# Patient Record
Sex: Female | Born: 1960 | Race: White | Hispanic: No | Marital: Married | Smoking: Current every day smoker
Health system: Southern US, Community
[De-identification: ages and names within clinical notes are randomized; demographics above are authoritative.]

## PROBLEM LIST (undated history)

## (undated) DIAGNOSIS — J309 Allergic rhinitis, unspecified: Secondary | ICD-10-CM

## (undated) DIAGNOSIS — E785 Hyperlipidemia, unspecified: Secondary | ICD-10-CM

## (undated) DIAGNOSIS — R5382 Chronic fatigue, unspecified: Secondary | ICD-10-CM

## (undated) DIAGNOSIS — Q231 Congenital insufficiency of aortic valve: Secondary | ICD-10-CM

## (undated) DIAGNOSIS — M5136 Other intervertebral disc degeneration, lumbar region: Secondary | ICD-10-CM

## (undated) DIAGNOSIS — G9332 Myalgic encephalomyelitis/chronic fatigue syndrome: Secondary | ICD-10-CM

## (undated) DIAGNOSIS — Z9889 Other specified postprocedural states: Secondary | ICD-10-CM

## (undated) DIAGNOSIS — G894 Chronic pain syndrome: Secondary | ICD-10-CM

## (undated) DIAGNOSIS — K219 Gastro-esophageal reflux disease without esophagitis: Secondary | ICD-10-CM

## (undated) DIAGNOSIS — E039 Hypothyroidism, unspecified: Secondary | ICD-10-CM

## (undated) DIAGNOSIS — F32A Depression, unspecified: Secondary | ICD-10-CM

## (undated) DIAGNOSIS — Q2381 Bicuspid aortic valve: Secondary | ICD-10-CM

## (undated) DIAGNOSIS — F329 Major depressive disorder, single episode, unspecified: Secondary | ICD-10-CM

## (undated) DIAGNOSIS — G43909 Migraine, unspecified, not intractable, without status migrainosus: Secondary | ICD-10-CM

## (undated) DIAGNOSIS — I10 Essential (primary) hypertension: Secondary | ICD-10-CM

## (undated) DIAGNOSIS — M51369 Other intervertebral disc degeneration, lumbar region without mention of lumbar back pain or lower extremity pain: Secondary | ICD-10-CM

## (undated) HISTORY — DX: Hyperlipidemia, unspecified: E78.5

## (undated) HISTORY — DX: Myalgic encephalomyelitis/chronic fatigue syndrome: G93.32

## (undated) HISTORY — DX: Allergic rhinitis, unspecified: J30.9

## (undated) HISTORY — DX: Hypothyroidism, unspecified: E03.9

## (undated) HISTORY — DX: Chronic pain syndrome: G89.4

## (undated) HISTORY — DX: Chronic fatigue, unspecified: R53.82

## (undated) HISTORY — DX: Essential (primary) hypertension: I10

## (undated) HISTORY — DX: Migraine, unspecified, not intractable, without status migrainosus: G43.909

## (undated) HISTORY — DX: Gastro-esophageal reflux disease without esophagitis: K21.9

## (undated) HISTORY — DX: Other intervertebral disc degeneration, lumbar region without mention of lumbar back pain or lower extremity pain: M51.369

## (undated) HISTORY — DX: Bicuspid aortic valve: Q23.81

## (undated) HISTORY — DX: Congenital insufficiency of aortic valve: Q23.1

## (undated) HISTORY — PX: NASAL SEPTUM SURGERY: SHX37

## (undated) HISTORY — DX: Depression, unspecified: F32.A

## (undated) HISTORY — DX: Other intervertebral disc degeneration, lumbar region: M51.36

## (undated) HISTORY — DX: Major depressive disorder, single episode, unspecified: F32.9

## (undated) HISTORY — PX: NASAL FRACTURE SURGERY: SHX718

---
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
## 1988-08-23 HISTORY — PX: ABDOMINAL HYSTERECTOMY: SHX81

## 2008-08-23 HISTORY — PX: LUMBAR LAMINECTOMY: SHX95

## 2009-06-02 ENCOUNTER — Ambulatory Visit: Payer: Self-pay | Admitting: Cardiology

## 2009-07-25 ENCOUNTER — Ambulatory Visit (HOSPITAL_COMMUNITY): Admission: RE | Admit: 2009-07-25 | Discharge: 2009-07-26 | Payer: Self-pay | Admitting: Neurosurgery

## 2009-08-23 HISTORY — PX: OTHER SURGICAL HISTORY: SHX169

## 2009-12-23 ENCOUNTER — Ambulatory Visit: Payer: Self-pay | Admitting: Orthopedic Surgery

## 2009-12-23 DIAGNOSIS — S92309A Fracture of unspecified metatarsal bone(s), unspecified foot, initial encounter for closed fracture: Secondary | ICD-10-CM | POA: Insufficient documentation

## 2009-12-30 ENCOUNTER — Ambulatory Visit: Payer: Self-pay | Admitting: Orthopedic Surgery

## 2009-12-30 ENCOUNTER — Encounter (INDEPENDENT_AMBULATORY_CARE_PROVIDER_SITE_OTHER): Payer: Self-pay | Admitting: *Deleted

## 2010-01-01 ENCOUNTER — Encounter: Payer: Self-pay | Admitting: Orthopedic Surgery

## 2010-01-02 ENCOUNTER — Encounter: Payer: Self-pay | Admitting: Orthopedic Surgery

## 2010-01-27 ENCOUNTER — Encounter: Payer: Self-pay | Admitting: Orthopedic Surgery

## 2010-02-17 ENCOUNTER — Ambulatory Visit: Payer: Self-pay | Admitting: Orthopedic Surgery

## 2010-02-17 DIAGNOSIS — M2350 Chronic instability of knee, unspecified knee: Secondary | ICD-10-CM

## 2010-02-20 ENCOUNTER — Telehealth: Payer: Self-pay | Admitting: Orthopedic Surgery

## 2010-02-23 ENCOUNTER — Encounter: Payer: Self-pay | Admitting: Orthopedic Surgery

## 2010-02-25 ENCOUNTER — Telehealth: Payer: Self-pay | Admitting: Orthopedic Surgery

## 2010-03-04 ENCOUNTER — Ambulatory Visit: Payer: Self-pay | Admitting: Orthopedic Surgery

## 2010-03-04 DIAGNOSIS — S86819A Strain of other muscle(s) and tendon(s) at lower leg level, unspecified leg, initial encounter: Secondary | ICD-10-CM

## 2010-03-04 DIAGNOSIS — S838X9A Sprain of other specified parts of unspecified knee, initial encounter: Secondary | ICD-10-CM | POA: Insufficient documentation

## 2010-03-12 ENCOUNTER — Encounter: Payer: Self-pay | Admitting: Orthopedic Surgery

## 2010-03-16 ENCOUNTER — Ambulatory Visit: Payer: Self-pay | Admitting: Orthopedic Surgery

## 2010-03-16 DIAGNOSIS — R609 Edema, unspecified: Secondary | ICD-10-CM

## 2010-03-17 ENCOUNTER — Encounter (INDEPENDENT_AMBULATORY_CARE_PROVIDER_SITE_OTHER): Payer: Self-pay | Admitting: *Deleted

## 2010-04-01 ENCOUNTER — Ambulatory Visit: Payer: Self-pay | Admitting: Orthopedic Surgery

## 2010-04-02 ENCOUNTER — Telehealth: Payer: Self-pay | Admitting: Orthopedic Surgery

## 2010-04-02 ENCOUNTER — Encounter: Payer: Self-pay | Admitting: Orthopedic Surgery

## 2010-04-09 ENCOUNTER — Telehealth: Payer: Self-pay | Admitting: Orthopedic Surgery

## 2010-04-20 ENCOUNTER — Encounter: Payer: Self-pay | Admitting: Orthopedic Surgery

## 2010-06-16 ENCOUNTER — Encounter: Payer: Self-pay | Admitting: Orthopedic Surgery

## 2010-07-09 ENCOUNTER — Encounter: Payer: Self-pay | Admitting: Orthopedic Surgery

## 2010-07-15 ENCOUNTER — Encounter: Payer: Self-pay | Admitting: Orthopedic Surgery

## 2010-08-05 ENCOUNTER — Encounter: Payer: Self-pay | Admitting: Orthopedic Surgery

## 2010-08-23 HISTORY — PX: OTHER SURGICAL HISTORY: SHX169

## 2010-09-22 NOTE — Consult Note (Signed)
Summary: Consult note from Dr. Lestine Box  Consult note from Dr. Lestine Box   Imported By: Jacklynn Ganong 04/20/2010 14:41:06  _____________________________________________________________________  External Attachment:    Type:   Image     Comment:   External Document

## 2010-09-22 NOTE — Letter (Signed)
Summary: Out of Work  [facility]  [address]  [place], [place] [address]   Phone: [phone]  Fax: [phone]    Dec 30, 2009   Employee:  Melissa Barton    To Whom It May Concern:   For Medical reasons, please excuse the above named employee from work for the following dates:  Start:   12/21/09  End:   03/23/10  If you need additional information, please feel free to contact our office.         Sincerely,    Terrance Mass, MD

## 2010-09-22 NOTE — Progress Notes (Signed)
Summary: Appointment with Dr. Lestine Box.  Phone Note From Other Clinic   Caller: Referral Coordinator Reason for Call: Schedule Patient Appt Summary of Call: Patient has appointment with Dr. Lestine Box today, 04-02-10 at 4:00. Initial call taken by: Waldon Reining,  April 02, 2010 1:56 PM

## 2010-09-22 NOTE — Progress Notes (Signed)
Summary: Referral to Dr. Lestine Box  Phone Note Outgoing Call   Call placed by: Waldon Reining,  April 02, 2010 10:03 AM Call placed to: Specialist Action Taken: Information Sent Summary of Call: I faxed a referral for this patient to Dr. Lestine Box to be seen for her foot fracture.

## 2010-09-22 NOTE — Miscellaneous (Signed)
Summary: Physical therapy order  Physical therapy order   Imported By: Cammie Sickle 03/10/2010 18:05:16  _____________________________________________________________________  External Attachment:    Type:   Image     Comment:   External Document

## 2010-09-22 NOTE — Letter (Signed)
Summary: History form  History form   Imported By: Jacklynn Ganong 12/31/2009 09:38:57  _____________________________________________________________________  External Attachment:    Type:   Image     Comment:   External Document

## 2010-09-22 NOTE — Letter (Signed)
Summary: Out of Work  [facility]  [address]  [place], [place] [address]   Phone: [phone]  Fax: [phone]    March 16, 2010   Employee:  Melissa Barton    To Whom It May Concern:  Patient was seen in our office for fol/up appointment today, 03/16/10.   For Medical reasons, please excuse/continue out of work status for the above named employee from work for the following dates:  Start:  12/21/09  End:   04/23/10, or until otherwise notified  Estimated return to work date:  04/24/10   If you need additional information, please feel free to contact our office.         Sincerely,    Terrance Mass, MD

## 2010-09-22 NOTE — Letter (Signed)
Summary: [employer] [place] parking [address] [place] parking placard   Imported By: Cammie Sickle 01/28/2010 17:57:37  _____________________________________________________________________  External Attachment:    Type:   Image     Comment:   External Document

## 2010-09-22 NOTE — Consult Note (Signed)
Summary: Consult note from Dr. Leonides Grills  Consult note from Dr. Leonides Grills   Imported By: Jacklynn Ganong 06/18/2010 09:53:13  _____________________________________________________________________  External Attachment:    Type:   Image     Comment:   External Document

## 2010-09-22 NOTE — Letter (Signed)
Summary: FMLA updated form  FMLA updated form   Imported By: Cammie Sickle 01/28/2010 17:50:57  _____________________________________________________________________  External Attachment:    Type:   Image     Comment:   External Document

## 2010-09-22 NOTE — Assessment & Plan Note (Signed)
Summary: 2 wk RE-CHECK,REVIEW CT RESULTS/?XRAY RT KNEE/AETNA/CAF   Visit Type:  Follow-up Referring Provider:  self Primary Provider:  Dr. Isac Sarna  CC:  right foot.  History of Present Illness: I saw Melissa Barton in the office today for a 2 week  followup visit.  She is a 50 years old woman with the complaint of:  right foot  Date of injury was May 1  Meds: Lisinopril, Protonix, Levothyroxine, Ultram, Lortab.  Basically she had a multi-metatarsal fracture over 2 months ago she was treated with a cast followed by Verdene Rio and then progressive weightbearing.  She continues to have significant pain and swelling in the RIGHT lower extremity especially in the foot.  She did improve somewhat in terms of the swelling which were TED hose.  She's had 2 ultrasounds which were negative to rule out DVT    Allergies: No Known Drug Allergies  Physical Exam  Additional Exam:  she has edema in the foot the ankle and leg area have returned to basically normal.  His excellent pulse in the dorsalis pedis.  She has some tenderness over the midfoot and then tenderness at the fracture site and plantar pain.  Skin is intact.  Normal sensation are no signs of RSD.  She ambulates with a antalgic gait.  Ankle range of motion is normal.  Ankle is stable.     Impression & Recommendations:  Problem # 1:  CLOSED FRACTURE OF METATARSAL BONE (ICD-825.25)  multiple fractures of the metatarsals 2, 3 and 4 with fibrous unions.  I am considering open treatment internal fixation due to continued pain in the forefoot with plantar pain although the x-rays did not seem to show plantar flexion of the fractures.  Orders: Est. Patient Level III (21308)  Medications Added to Medication List This Visit: 1)  Ultram 50 Mg Tabs (Tramadol hcl) .Marland Kitchen.. 1 by mouth q 4 as needed  Patient Instructions: 1)  I am sending you for a second opinion ref fractures in your foot  2)  you will get a call from Dr Ashok Norris office    3)  after that you will follow up here so call us as soon as that apointment is completed  Prescriptions: ULTRAM 50 MG TABS (TRAMADOL HCL) 1 by mouth q 4 as needed  #60 x 5   Entered and Authorized by:   Fuller Canada MD   Signed by:   Fuller Canada MD on 04/01/2010   Method used:   Print then Give to Patient   RxID:   [id number]

## 2010-09-22 NOTE — Assessment & Plan Note (Signed)
Summary: per dr h rt foot fracture.cbt   Vital Signs:  Patient profile:   50 year old female Height:      63 inches Weight:      196 pounds Pulse rate:   72 / minute Resp:     16 per minute  Visit Type:  new patient Referring Provider:  self Primary Provider:  Dr. Isac Sarna  CC:  right foot pain.  History of Present Illness: 50 year old female injured on May 1.  The dog ran into her RIGHT knee after she fell with an apparent patellar dislocation she relocated the knee and her foot started hurting.  X-rays of the knee were negative x-rays of the foot show multiple fractures  DOI 12/21/09.  [facility] xrays from 12/21/09 for review on board.  Meds: Lisinopril, Protonix, Levothyroxine, Celexa, Ultram, Lortab 5.  No Ibuprofen.    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 Cardiovascular:  Complains of palpitations; denies chest pain, fainting, and murmurs. Respiratory:  Denies short of breath, wheezing, couch, tightness, pain on inspiration, and snoring . Gastrointestinal:  Complains of heartburn and nausea; denies vomiting, diarrhea, constipation, and blood in your stools. Genitourinary:  Denies frequency, urgency, difficulty urinating, painful urination, flank pain, and bleeding in urine. Neurologic:  Complains of dizziness; denies numbness, tingling, unsteady gait, tremors, and seizure. Musculoskeletal:  Complains of joint pain, stiffness,  and muscle pain; denies swelling, instability, redness, and heat. Endocrine:  Complains of excessive thirst and heat or cold intolerance; denies exessive urination. Psychiatric:  Complains of depression; denies nervousness, anxiety, and hallucinations. Skin:  Denies changes in the skin, poor healing, rash, itching, and redness. HEENT:  Complains of blurred or double vision; denies eye pain, redness, and watering. Immunology:  Denies seasonal allergies, sinus problems, and allergic to bee stings. Hemoatologic:  Complains of brusing; denies easy bleeding.  Physical Exam  Additional Exam:   * VS reviewed and were normal   *GEN: appearance was normal medium to large body frame  ** CDV: normal pulses temperature and no edema  * LYMPH nodes were normal   * SKIN was noted for abrasion medial side great toe full-thickness but no infection seen  * Neuro: normal sensation ** Psyche: AAO x 3 and mood was normal   MSK abnormal gait she is not able to place weight on the foot.  Her foot is very swollen and very tender range of motion ankle is normal motor great strength 5 stability of the ankle normal    Impression & Recommendations: the x-rays show a slightly displaced second third and fourth metatarsal neck fractures.  The alignment of the toes is grossly normal.  There is no major angulation on the lateral  Recommend edema control splinting return for casting.  Discharge were Lortab 5 #90, 2 refills and Zofran absorbable tablets 4 mg #0, 2 refills  Other Orders: New Patient Level III ([address]109) Metatarsal Fx (60454)  Patient Instructions: 1)  NO WEIGHT ON THE RIGHT FOOT  2)  RETURN IN 1 WEEK FOR CAST

## 2010-09-22 NOTE — Letter (Signed)
Summary: FMLA form  FMLA form   Imported By: Cammie Sickle 01/03/2010 17:40:41  _____________________________________________________________________  External Attachment:    Type:   Image     Comment:   External Document

## 2010-09-22 NOTE — Consult Note (Signed)
Summary: Consult note from Dr. Lestine Box  Consult note from Dr. Lestine Box   Imported By: Jacklynn Ganong 04/06/2010 07:53:55  _____________________________________________________________________  External Attachment:    Type:   Image     Comment:   External Document

## 2010-09-22 NOTE — Progress Notes (Signed)
Summary: foot is very swollen  Phone Note Call from Patient   Reason for Call: Privacy/Consent Authorization Summary of Call: Sonji Starkes says her foot is very swollen - much more than usual - She has been light WB with crutches since her last office visit. Has an appointment 03/04/10 but wants to know if you need to check her foot before the next appointment.  Please advise Her # [phone] Initial call taken by: Jacklynn Ganong,  February 25, 2010 10:09 AM  Follow-up for Phone Call        swelling is not unusual   apply ice 30 min q 6 hrs decrease the amount of weight bearing x 48 hrs   elevate the foot when not walking   and see if this helps  Follow-up by: Fuller Canada MD,  February 25, 2010 10:19 AM  Additional Follow-up for Phone Call Additional follow up Details #1::        Advised patient of doctor's reply Additional Follow-up by: Jacklynn Ganong,  February 25, 2010 10:37 AM

## 2010-09-22 NOTE — Letter (Signed)
Summary: [employer] disability forms  [employer] disability forms   Imported By: Cammie Sickle 01/02/2010 14:36:15  _____________________________________________________________________  External Attachment:    Type:   Image     Comment:   External Document

## 2010-09-22 NOTE — Assessment & Plan Note (Signed)
Summary: 1 WK RE-CK/CAST LT FOOT/PRIM PHYS,AETNA/CAF   Visit Type:  Follow-up Referring Provider:  self Primary Provider:  Dr. Isac Sarna  CC:  left foot fracture care.  History of Present Illness: 50 year old female injured on May 1.  The dog ran into her RIGHT knee after she fell with an apparent patellar dislocation she relocated the knee and her foot started hurting.  X-rays of the knee were negative x-rays of the foot show multiple fractures  DOI 12/21/09.  [facility] xrays from 12/21/09 for review on board.  Meds: Lisinopril, Protonix, Levothyroxine, Celexa, Ultram, Lortab 5.  short-leg cast applied to the LEFT foot with a toe plate    Allergies: No Known Drug Allergies   Impression & Recommendations:  Problem # 1:  CLOSED FRACTURE OF METATARSAL BONE (ICD-825.25) Assessment Improved  Orders: Post-Op Check (16109) Short Leg Cast (60454)  Patient Instructions: 1)  OOW change to 3 months from DOI  12/21/2009 2)  return in  7 weeks cast off xrays

## 2010-09-22 NOTE — Assessment & Plan Note (Signed)
Summary: 2 wk RE-CK RT KNEE FOL'G PT/AETNA/CAF   Visit Type:  Follow-up Referring Provider:  self Primary Provider:  Dr. Isac Sarna  CC:  2 week recheck rt knee.  History of Present Illness: followup visit status post fracture RIGHT foot and injured her RIGHT knee with presumed patellar dislocation patient was treated with short leg cast for 7-1/2 weeks for multiple metatarsal fractures  She was sent for MRI of her RIGHT knee because of laxity on physical exam  She is also complaining of increased pain and swelling in the RIGHT foot after starting weightbearing  Date of injury was May 1  Meds: Lisinopril, Protonix, Levothyroxine, Ultram, Lortab.  MRI of the knee is negative for any major structural damage  Today is 2 week recheck right knee after PT, progress note for signature.  Still has swelling and redness, she is still on crutches, no weightbearing.  the contrast baths seemed to make her ankle worse in terms of the swelling her foot is also swollen.  She did have an ultrasound but was in a complete study and it was negative.  It seems that her foot hurts more when she took the cast off and the swelling is worse         Allergies: No Known Drug Allergies  Physical Exam  Extremities:  just ambulating she walks with full weightbearing and crutch  She has a lot of swelling in the foot and also the ankle just tenderness in the back of the calf as well as the Achilles tendon.  The dorsum of the foot is also tender.  Her ankle motion is about 25 total.  Her ankle is stable.  Normal pulses and perfusion.  Sensation is not hyperesthetic or anesthetic.  It is normal   Impression & Recommendations:  Problem # 1:  CLOSED FRACTURE OF METATARSAL BONE (ICD-825.25) Assessment Improved  at this point is unclear why she is still swelling.  Differential diagnosis RSD, DVT, delayed union of the metatarsal fractures.  Recommend repeat ultrasound test, compression hose,  Epsom salt soaks and a Cam Walker  Orders: Post-Op Check (40102)  Problem # 2:  EDEMA (ICD-782.3) Assessment: Unchanged  Patient Instructions: 1)  Ultrasound test  2)  Compression hose  3)  Epsom salt soaks for the foot  4)  Can not return to work August 1st [OOW 4 weeks] 5)  Please schedule a follow-up appointment in 2 weeks.

## 2010-09-22 NOTE — Assessment & Plan Note (Signed)
Summary: 7 WK RE-CK/XRAYS/RT FOOT/FX CARE/CAF   Visit Type:  Follow-up Referring Provider:  self Primary Provider:  Dr. Isac Sarna  CC:  fx care rt foot.  History of Present Illness: 50 year old female injured on May 1.  The dog ran into her RIGHT knee after she fell with an apparent patellar dislocation she relocated the knee and her foot started hurting.  X-rays of the knee were negative x-rays of the foot show multiple fractures, metatarsals 23 and 4  DOI 12/21/09.  [facility] xrays from 12/21/09   Meds: Lisinopril, Protonix, Levothyroxine, Celexa, Ultram, Lortab 5.  short-leg cast applied to the right  foot with a toe plate  Today is 7 week recheck with xrays OOP  Exam shows that the toes remain straight most of the swelling has resolved there is minimal tenderness at the fracture sites  A reexamine her RIGHT knee.  She has a slight posterior drawer sign positive sag mild.  Anterior drawer normal Lachman normal collateral ligaments stable.  Possible hyperextension injury recommend MRI to followup possible PCL injury          Allergies: No Known Drug Allergies   Other Orders: Post-Op Check (16109) Est. Patient Level II (60454)  Patient Instructions: 1)  MRI RT KNEE THEN F/U HERE IN 2 WEEKS

## 2010-09-22 NOTE — Progress Notes (Signed)
Summary: question about seeing Dr. Lestine Box  Phone Note Call from Patient   Summary of Call: [name] has seen Dr. Lestine Box and he suggested surgery.  She has had an  MRI and CT and is scheduled to see him again 04/13/10. Her question is, does she need to see you again or does she continue with Dr. Lestine Box for him to do the suirgery? Her # [phone] Initial call taken by: Jacklynn Ganong,  April 09, 2010 11:18 AM  Follow-up for Phone Call        please see him on the 22nd   if he recommends surgery then call us back and I will call her to discuss who should do it   I really need his input from that visit on the 22nd  Follow-up by: Fuller Canada MD,  April 09, 2010 1:52 PM  Additional Follow-up for Phone Call Additional follow up Details #1::        Advised patient of doctor's reply Additional Follow-up by: Jacklynn Ganong,  April 09, 2010 3:03 PM

## 2010-09-22 NOTE — Assessment & Plan Note (Signed)
Summary: 2 WK RE-CK RT KNEE/MRI FOL/UP/[facility]/AETNA/CAF   Referring Provider:  self Primary Provider:  Dr. Isac Sarna   History of Present Illness: followup visit status post fracture RIGHT foot and injured her RIGHT knee with presumed patellar dislocation patient was treated with short leg cast for 7-1/2 weeks for multiple metatarsal fractures  She was sent for MRI of her RIGHT knee because of laxity on physical exam  She is also complaining of increased pain and swelling in the RIGHT foot after starting weightbearing  Date of injury was May 1  Meds: Lisinopril, Protonix, Levothyroxine, Ultram, Lortab.  Today is recheck on knee review MRI results of the knee from [facility], negative 02/23/10.  She says her right ankle continues to swell, uses crutches still.  Feels her knee popping with walking.  Has redness of the lower leg wih swelling.            Allergies: No Known Drug Allergies  Physical Exam  Additional Exam:  I have observed severe swelling of the RIGHT foot and ankle with tenderness in the calf area she said she had a negative ultrasound yesterday with her colleagues at work  There is no warmth of the ankle, but there is some leg edema distally.     Impression & Recommendations:  Problem # 1:  CLOSED FRACTURE OF METATARSAL BONE (ICD-825.25) Assessment Comment Only  swelling of the foot question cause fractures have largely healed but they are fibrous unions so maybe this is the cause, recommending contrast baths  Orders: Est. Patient Level II (61607)  Problem # 2:  POSTERIOR CRUCIATE LIGAMENT TEAR, RIGHT KNEE (ICD-717.84) Assessment: Comment Only pulled out for posterior cruciate ligament tear  Her MRI was reviewed with the report there is no cruciate ligament tears no meniscal tear there is no intra-articular damage there is some swelling in the gastrocs  Problem # 3:  SPRAIN&STRAIN OTHER SPECIFIED SITES KNEE&LEG (ICD-844.8)  quadriceps  weakness recommend physical therapy  Orders: Est. Patient Level II (37106)  Patient Instructions: 1)  Contrasts baths two times a day for 2 weeks 2)  Quad exercises for right leg 3)  Come back in 2 weeks for recheck

## 2010-09-22 NOTE — Miscellaneous (Signed)
Summary: Rehab Report [facility]AR  Rehab Report [facility]   Imported By: Cammie Sickle 03/17/2010 11:39:09  _____________________________________________________________________  External Attachment:    Type:   Image     Comment:   External Document

## 2010-09-22 NOTE — Letter (Signed)
Summary: [facility] office note Dr [facility] office note Dr Lestine Box   Imported By: Cammie Sickle 07/15/2010 16:50:53  _____________________________________________________________________  External Attachment:    Type:   Image     Comment:   External Document

## 2010-09-22 NOTE — Miscellaneous (Signed)
Summary: Venous Doppler scheduled at [facility], 03/19/10 arrive at 3pm  Clinical Lists Changes

## 2010-09-22 NOTE — Progress Notes (Signed)
Summary: MRI appointment.  Phone Note Outgoing Call   Call placed by: Waldon Reining,  February 20, 2010 9:57 AM Call placed to: Patient Action Taken: Appt scheduled Summary of Call: Patient has an MRI appointment at [facility] on 02-26-10 at 4:10, arrive at 3:50. Patient has [employer]  and [employer], no precert is required. Patient will follow up back here for her results and she is aware to bring a copy of her films.

## 2010-09-22 NOTE — Medication Information (Signed)
Summary: RX Folder  RX Folder   Imported By: Cammie Sickle 03/17/2010 11:40:35  _____________________________________________________________________  External Attachment:    Type:   Image     Comment:   External Document

## 2010-09-22 NOTE — Letter (Signed)
Summary: Medical record request [employer] [facility] [employer]  Medical record request [employer] of [employer]   Imported By: Cammie Sickle 07/17/2010 14:19:15  _____________________________________________________________________  External Attachment:    Type:   Image     Comment:   External Document

## 2010-09-22 NOTE — Letter (Signed)
Summary: Out of Work  [facility]  [address]  [place], [place] [address]   Phone: [phone]  Fax: [phone]    Dec 23, 2009   Employee:  MYRTIS MAILLE    To Whom It May Concern:   For Medical reasons, please excuse the above named employee from work for the following dates:  Start:   12/22/2009  End:   01/12/2010  If you need additional information, please feel free to contact our office.         Sincerely,    Fuller Canada MD

## 2010-09-24 NOTE — Letter (Signed)
Summary: medical record request from [facility] [employer]  medical record request from [facility] [employer]   Imported By: Jacklynn Ganong 08/05/2010 12:21:33  _____________________________________________________________________  External Attachment:    Type:   Image     Comment:   External Document

## 2010-09-28 ENCOUNTER — Encounter: Payer: Self-pay | Admitting: Orthopedic Surgery

## 2010-10-08 NOTE — Letter (Signed)
Summary: Medical record req [facility] no new records  Medical record req [facility] no new records   Imported By: Cammie Sickle 09/29/2010 12:06:54  _____________________________________________________________________  External Attachment:    Type:   Image     Comment:   External Document

## 2010-11-25 LAB — URINALYSIS, ROUTINE W REFLEX MICROSCOPIC
Bilirubin Urine: NEGATIVE
Nitrite: NEGATIVE
Specific Gravity, Urine: 1.008 (ref 1.005–1.030)
pH: 7 (ref 5.0–8.0)

## 2010-11-25 LAB — CBC
HCT: 38.4 % (ref 36.0–46.0)
Hemoglobin: 13.6 g/dL (ref 12.0–15.0)
Platelets: 312 10*3/uL (ref 150–400)
WBC: 7.9 10*3/uL (ref 4.0–10.5)

## 2010-11-25 LAB — BASIC METABOLIC PANEL
Chloride: 101 mEq/L (ref 96–112)
Creatinine, Ser: 0.85 mg/dL (ref 0.4–1.2)
Potassium: 4.1 mEq/L (ref 3.5–5.1)
Sodium: 139 mEq/L (ref 135–145)

## 2010-12-24 ENCOUNTER — Ambulatory Visit (INDEPENDENT_AMBULATORY_CARE_PROVIDER_SITE_OTHER): Payer: Medicare HMO | Admitting: Internal Medicine

## 2010-12-24 DIAGNOSIS — K219 Gastro-esophageal reflux disease without esophagitis: Secondary | ICD-10-CM

## 2010-12-24 DIAGNOSIS — R1310 Dysphagia, unspecified: Secondary | ICD-10-CM

## 2011-01-12 NOTE — Consult Note (Signed)
[name], [name]                ACCOUNT NO.:  [id number]  MEDICAL RECORD NO.:  [id number]           PATIENT TYPE:  LOCATION:                                 FACILITY:  PHYSICIAN:  Melissa Barton, M.D.    DATE OF BIRTH:  1960/09/30  DATE OF CONSULTATION:  12/24/2010 DATE OF DISCHARGE:                                CONSULTATION   REASON FOR CONSULTATION:  Acid reflux/EGD.  HISTORY OF PRESENT ILLNESS:  Melissa Barton is a 50 year old female referred to our office by Dr. Margo Barton in [place].  She is having acid reflux on a daily basis.  She states that if she misses a dose of her Protonix she will have acid reflux.  She has had symptoms of acid reflux since age 32. Her symptoms have, however, been worse for the last couple of months. She has been under a lot of stress with a recent death of her father and she has custody of her 41-year-old grandchild.  She is on Carafate a.c. and at bedtime and Protonix once daily dosing which has helped.  She says she cannot eat large meals.  She is avoiding spicy foods.  She says if she bends over the acid reflux will bubble up and she will vomit it up.  She did undergo an EGD/ED in [place], [place] about 8 years ago which showed gastritis.  She also says she has dysphagia 2-3 times a week.  She says cold drinks feel like they will lodge in her esophagus.  She also says that if she does not chew her food well they will lodge.  She usually has a bowel movement a day.  She denies any melena or bright red rectal bleeding.  HOME MEDICATIONS: 1. Zofran 4 mg as needed. 2. Vitamin D3 2000 units a day. 3. Carafate 1 g a.c. and at bedtime. 4. Tramadol 50 mg as needed. 5. Isometh/APAP and then Dichlor as needed and then hydrocodone/APAP     as needed. 6. Lisinopril 20 mg a day. 7. Synthroid 75 mcg a day, 8. Celexa 20 mg a day. 9. Xanax 1 mg twice a day as needed. 10.Protonix 40 mg a day.  There are no known allergies.  SURGERIES:  She has had a foot  fracture repair.  She has had a lumbar laminectomy, appendectomy, partial hysterectomy, and a partial vulvectomy.  MEDICAL HISTORY: 1. Hypertension. 2. Hypothyroidism. 3. Reflux. 4. Depression. 5. Anxiety.  FAMILY HISTORY:  Her mother is deceased from pancreatic cancer at age 20.  Her father is deceased from multiple strokes, but he had an MI. Two sisters, one has arthritis and one in good health.  Two brothers in good health.  She is married.  She is unemployed.  She is a Ship broker, previously working at [employer].  She smokes 1/2 a pack a day x35 years.  She has one child in good health.  OBJECTIVE:  VITAL SIGNS:  Her weight is 194.  Her height is 5 feet and 4 inches.  Her temperature is 98.7.  Her blood pressure is 122/72.  Her pulse is 72. HEENT:  She has natural  teeth.  Her oral mucosa is moist.  There are no lesions.  Her conjunctivae are pink.  Her sclerae are anicteric. NECK:  Her thyroid is normal.  There is no cervical lymphadenopathy. LUNGS:  Clear. HEART:  Regular rate and rhythm. ABDOMEN:  Soft.  Bowel sounds are positive.  No masses.  No abdominal pain.  There is no edema to her lower extremities.  ASSESSMENT:  Ms. Melissa Barton is a 50 year old female presenting with acid reflux.  She is, however, not controlled with Carafate and Protonix at this time as she would like.  She has had acid reflux since age 89. Erosive esophagitis, peptic ulcer disease, or Barrett esophagus needs to be ruled out.  She is also having dysphagia and esophageal web or stricture needs to be ruled out.  RECOMMENDATIONS:  We will schedule an EGD in near future.   Thank you for allowing [name] to participate in her care.    ______________________________ Dorene Ar, NP   ______________________________ Melissa Barton, M.D.    TS/MEDQ  D:  12/24/2010  T:  12/25/2010  Job:  [id number]  cc:   Dr. Rush Barer, [place]  Electronically Signed by Dorene Ar PA on 12/28/2010  12:03:42 PM Electronically Signed by Melissa Barton M.D. on 01/12/2011 12:24:15 PM

## 2011-01-28 ENCOUNTER — Ambulatory Visit (HOSPITAL_COMMUNITY)
Admission: RE | Admit: 2011-01-28 | Discharge: 2011-01-28 | Disposition: A | Discharge status: Home / Self Care | Payer: Managed Care, Other (non HMO) | Source: Ambulatory Visit | Attending: Internal Medicine | Admitting: Internal Medicine

## 2011-01-28 ENCOUNTER — Encounter (HOSPITAL_BASED_OUTPATIENT_CLINIC_OR_DEPARTMENT_OTHER): Payer: Managed Care, Other (non HMO) | Admitting: Internal Medicine

## 2011-01-28 ENCOUNTER — Other Ambulatory Visit (INDEPENDENT_AMBULATORY_CARE_PROVIDER_SITE_OTHER): Payer: Self-pay | Admitting: Internal Medicine

## 2011-01-28 DIAGNOSIS — E785 Hyperlipidemia, unspecified: Secondary | ICD-10-CM | POA: Insufficient documentation

## 2011-01-28 DIAGNOSIS — Z79899 Other long term (current) drug therapy: Secondary | ICD-10-CM | POA: Insufficient documentation

## 2011-01-28 DIAGNOSIS — K219 Gastro-esophageal reflux disease without esophagitis: Secondary | ICD-10-CM

## 2011-01-28 DIAGNOSIS — I10 Essential (primary) hypertension: Secondary | ICD-10-CM | POA: Insufficient documentation

## 2011-01-28 DIAGNOSIS — K2960 Other gastritis without bleeding: Secondary | ICD-10-CM | POA: Insufficient documentation

## 2011-01-28 DIAGNOSIS — R1310 Dysphagia, unspecified: Secondary | ICD-10-CM

## 2011-02-22 ENCOUNTER — Ambulatory Visit (HOSPITAL_COMMUNITY)
Admission: RE | Admit: 2011-02-22 | Discharge: 2011-02-22 | Disposition: A | Discharge status: Home / Self Care | Payer: Managed Care, Other (non HMO) | Source: Ambulatory Visit | Attending: Orthopedic Surgery | Admitting: Orthopedic Surgery

## 2011-02-22 ENCOUNTER — Other Ambulatory Visit (HOSPITAL_COMMUNITY): Payer: Self-pay | Admitting: Orthopedic Surgery

## 2011-02-22 ENCOUNTER — Encounter (HOSPITAL_COMMUNITY)
Admission: RE | Admit: 2011-02-22 | Discharge: 2011-02-22 | Disposition: A | Discharge status: Home / Self Care | Payer: Managed Care, Other (non HMO) | Source: Ambulatory Visit | Attending: Orthopedic Surgery | Admitting: Orthopedic Surgery

## 2011-02-22 DIAGNOSIS — Z01818 Encounter for other preprocedural examination: Secondary | ICD-10-CM | POA: Insufficient documentation

## 2011-02-22 DIAGNOSIS — M549 Dorsalgia, unspecified: Secondary | ICD-10-CM

## 2011-02-22 DIAGNOSIS — Z01812 Encounter for preprocedural laboratory examination: Secondary | ICD-10-CM | POA: Insufficient documentation

## 2011-02-22 DIAGNOSIS — Z01810 Encounter for preprocedural cardiovascular examination: Secondary | ICD-10-CM | POA: Insufficient documentation

## 2011-02-22 LAB — BASIC METABOLIC PANEL
CO2: 28 mEq/L (ref 19–32)
Chloride: 99 mEq/L (ref 96–112)
Potassium: 4.1 mEq/L (ref 3.5–5.1)
Sodium: 137 mEq/L (ref 135–145)

## 2011-02-22 LAB — CBC
MCV: 88.2 fL (ref 78.0–100.0)
Platelets: 280 10*3/uL (ref 150–400)
RBC: 4.33 MIL/uL (ref 3.87–5.11)
WBC: 8.5 10*3/uL (ref 4.0–10.5)

## 2011-02-22 LAB — SURGICAL PCR SCREEN
MRSA, PCR: NEGATIVE
Staphylococcus aureus: POSITIVE — AB

## 2011-02-22 LAB — TYPE AND SCREEN
ABO/RH(D): A POS
Antibody Screen: NEGATIVE

## 2011-02-22 NOTE — Op Note (Signed)
  [name], [name]                 ACCOUNT NO.:  [id number]  MEDICAL RECORD NO.:  [id number]  LOCATION:  [facility]                          FACILITY:  [facility]  PHYSICIAN:  Lionel December, M.D.    DATE OF BIRTH:  1960-10-12  DATE OF PROCEDURE:  01/28/2011 DATE OF DISCHARGE:                              OPERATIVE REPORT   PROCEDURE:  Esophagogastroduodenoscopy with esophageal dilation.  INDICATION:  [name] is a 50 year old Caucasian female who has had heartburn since she was 50 years old currently on antireflux measures and PPI who complains of dysphagia to solids and cold foods.  She points to suprasternal area as site of bolus obstruction.  She is undergoing diagnostic/therapeutic EGD.  Procedures risks were reviewed with the patient and informed consent was obtained.  MEDICATIONS FOR CONSCIOUS SEDATION: 1. Cetacaine spray for pharyngeal topical anesthesia. 2. Demerol 50 mg IV. 3. Versed 10 mg IV.  FINDINGS:  Procedure performed in endoscopy suite.  The patient's vital signs and O2 sats were monitored during the procedure and remained stable.  The patient was placed in left lateral recumbent position and Pentax videoscope was passed through oropharynx without any difficulty into esophagus.  Esophagus.  Mucosa of the proximal segment was normal and gastric at esophageal body there was fine mucosal ridges, but no high-grade ring or stricture was noted.  GE junction was wavy located at 37 -cm with single erosion about 7-8 mm long extending in cephalad direction.  Stomach.  It was empty and distended very well with insufflation.  Folds of the proximal stomach are normal.  There were erosions at antrum. Pyloric channel was patent.  Angularis, fundus and cardia were examined by retroflexing scope and were normal.  Duodenum.  Bulbar mucosa was normal.  Scope was passed into second part of the duodenum, where mucosa and folds were normal.  Close attention was placed into the folds of the  line.  These appeared to be normal. Endoscope was withdrawn.  Esophagus.  It was dilated by passing 54-French Maloney dilator to full insertion.  As the dilators were withdrawn, esophageal mucosa reexamined and no disruption noted.  On the way out, biopsy was taken from mucosa of the esophagus looking for eosinophilic esophagitis.  The patient tolerated the procedure well.  FINAL DIAGNOSES: 1. Fine mucosal ridges to esophageal mucosa at body.  The biopsy taken     to rule out eosinophilic esophagitis. 2. Erosive esophagitis.  She has single erosion at distal esophagus     about 6-7 mm long. 3. Erosive antral gastritis. 4. Esophagus dilated per passing 54-French Maloney dilator.  RECOMMENDATIONS: 1. Antireflux measures reinforced. 2. We will increase pantoprazole to 40 mg p.o. b.i.d. 3. I will be contacting the patient with results of biopsy and we will     also check her H. pylori serology, and I would like to see her back     in the office in 4 months.          ______________________________ Lionel December, M.D.     NR/MEDQ  D:  01/28/2011  T:  01/28/2011  Job:  [id number]  Electronically Signed by Lionel December M.D. on 02/22/2011 12:23:34 AM

## 2011-02-25 ENCOUNTER — Inpatient Hospital Stay (HOSPITAL_COMMUNITY): Payer: Managed Care, Other (non HMO)

## 2011-02-25 ENCOUNTER — Inpatient Hospital Stay (HOSPITAL_COMMUNITY)
Admission: RE | Admit: 2011-02-25 | Discharge: 2011-02-27 | DRG: 460 | Disposition: A | Discharge status: Home / Self Care | Payer: Managed Care, Other (non HMO) | Source: Ambulatory Visit | Attending: Orthopedic Surgery | Admitting: Orthopedic Surgery

## 2011-02-25 DIAGNOSIS — K219 Gastro-esophageal reflux disease without esophagitis: Secondary | ICD-10-CM | POA: Diagnosis present

## 2011-02-25 DIAGNOSIS — M51379 Other intervertebral disc degeneration, lumbosacral region without mention of lumbar back pain or lower extremity pain: Principal | ICD-10-CM | POA: Diagnosis present

## 2011-02-25 DIAGNOSIS — M5137 Other intervertebral disc degeneration, lumbosacral region: Secondary | ICD-10-CM

## 2011-02-25 DIAGNOSIS — F17200 Nicotine dependence, unspecified, uncomplicated: Secondary | ICD-10-CM | POA: Diagnosis present

## 2011-02-25 DIAGNOSIS — I10 Essential (primary) hypertension: Secondary | ICD-10-CM | POA: Diagnosis present

## 2011-02-26 ENCOUNTER — Inpatient Hospital Stay (HOSPITAL_COMMUNITY): Payer: Managed Care, Other (non HMO)

## 2011-02-26 DIAGNOSIS — M961 Postlaminectomy syndrome, not elsewhere classified: Secondary | ICD-10-CM

## 2011-02-26 DIAGNOSIS — Z09 Encounter for follow-up examination after completed treatment for conditions other than malignant neoplasm: Secondary | ICD-10-CM

## 2011-03-06 NOTE — Op Note (Signed)
NAMECHENE, KASINGER                 ACCOUNT NO.:  [id number]  MEDICAL RECORD NO.:  [id number]  LOCATION:  5013                         FACILITY:  [facility]  PHYSICIAN:  Alvy Beal, MD    DATE OF BIRTH:  August 21, 1961  DATE OF PROCEDURE:  02/25/2011 DATE OF DISCHARGE:                              OPERATIVE REPORT   PREOPERATIVE DIAGNOSIS:  Post-laminectomy syndrome, L5-S1 with degenerative disk disease, L5-S1.  POSTOPERATIVE DIAGNOSIS:  Post-laminectomy syndrome, L5-S1 with degenerative disk disease, L5-S1.  OPERATIVE PROCEDURE:  Anterior lumbar interbody fusion, L5-S1.  SURGEON:  Shaunessy Dobratz D. Shon Baton, MD  FIRST ASSISTANT:  Norval Gable, PA  APPROACH SURGEON:  Larina Earthly, MD  HISTORY:  This is a very pleasant 50 year old woman who has been having progressive debilitating back pain.  About a year-and-half ago, she had a diskectomy done for back and significant leg pain.  She states she got some relief of her leg pain but the back pain has gotten significantly worse.  X-rays demonstrate collapse of the disk and advancing degenerative disease.  After discussing treatment options, she elected to proceed with surgery to treat the degenerative disk disease.  All appropriate risks, benefits, and alternatives of surgery were discussed with the patient.  Consent was obtained.  OPERATIVE NOTE:  The patient was brought to the operating room and placed supine on the operating table.  After successful induction of general anesthesia and endotracheal intubation, TEDs, SCDs, and Foley were inserted.  The abdomen was prepped and draped in a standard fashion.  Appropriate time-out was done to confirm patient, procedure, and all other pertinent important data.  Once this was done, myself and Dr. Arbie Cookey scrubbed in.  Dr. Arbie Cookey then did a standard retroperitoneal approach to the lumbar spine.  Please refer to his dictation for specifics on the approach.  Once the [facility] retractors  were positioned, a needle was placed in the L5-S4 disk and we confirmed that we were at the appropriate level.  Once this was done, Dr. Arbie Cookey scrubbed out.  Myself and Norval Gable then proceeded with surgery.  An annulotomy was performed and I removed the disk with a combination of pituitary rongeurs, curettes, and Kerrison rongeurs.  I then distracted the intervertebral space and then released the posterior annulus from the L5 and S1 vertebral bodies.  I then used a 2-mm Kerrison to remove the osteophytes from the posterior aspect of the vertebral bodies themselves.  I then rasped the endplates, so I had nice bleeding and subchondral bone.  I then trialed with a 14 and 12-degree lordotic large trial and I had satisfactory fit.  I then used a laminar spreader under live fluoro to confirm that I had parallel distraction and the neural foramen were truly being decompressed.  Once I had done this, I then obtained a 14 RSD PEEK interbody spacer, packed it with chronOS as well as DBX.  I then malleted this to the appropriate depth.  I then took the inner plate anterior zero profile plate and malleted it to the appropriate position.  I then secured it with 2 screws into the body of L5 and 1 screw into the body of S1.  All screws had excellent purchase and a locking plate was applied.  I then copiously irrigated the wound with normal saline and then obtained hemostasis was bipolar electrocautery as well as FloSeal.  I then sequentially removed the retractor blades and there was no significant bleeding noted.  I then closed the rectus sheath with a running Prolene suture and then did a 2- layer closure with running #1 Vicryl suture and interrupted 2-0 Vicryl sutures and 3-0 Monocryl was used to close the skin edges.  Steri-Strips and dry dressing were applied.  At the end of the case, all needle and sponge counts were correct.  Intraoperative final x-rays were satisfactory and the [facility] x-ray  showed no retained surgical hardware other than the implant themselves.  The patient was extubated and transferred to PACU without incident.  Instrumentation system used was the RSB interplate and PEEK interbody spacer and chronOS as the synthetic bone matrix.     Alvy Beal, MD     DDB/MEDQ  D:  02/25/2011  T:  02/26/2011  Job:  [id number]  Electronically Signed by Venita Lick MD on 03/06/2011 08:14:32 AM

## 2011-03-11 NOTE — Op Note (Signed)
  NAMECARO, BRUNDIDGE                 ACCOUNT NO.:  [id number]  MEDICAL RECORD NO.:  [id number]  LOCATION:  5013                         FACILITY:  [facility]  PHYSICIAN:  Larina Earthly, M.D.    DATE OF BIRTH:  02-12-1961  DATE OF PROCEDURE:  02/25/2011 DATE OF DISCHARGE:                              OPERATIVE REPORT   PREOPERATIVE DIAGNOSIS:  Degenerative disk disease, L5-S1.  POSTOPERATIVE DIAGNOSIS:  Degenerative disk disease, L5-S1.  PROCEDURE:  Anterior exposure for ALIF which will be dictated as a separate note by Dr. Venita Lick.  CO-SURGEONS FOR THE EXPOSURE:  Larina Earthly, MD and Alvy Beal, MD  ANESTHESIA:  General endotracheal.  COMPLICATIONS:  None.  DISPOSITION:  To recovery room in stable following fusion which will be dictated as a separate note by Dr. Shon Baton.  PROCEDURE IN DETAIL:  The patient was taken to the operating room and placed in supine position.  The area of the abdomen was prepped and draped in the usual sterile fashion.  Lateral C-arm was used to identify the level of L5-S1 disk on the skin.  Incision was made transversely from the level of the midline laterally.  This was continued down through subcutaneous fat and the fat was mobilized off the anterior rectus sheath.  The anterior rectus sheath was opened in line with the skin incision.  Rectus muscle was mobilized circumferentially.  The retroperitoneal space was entered lateral to the rectus muscle and blunt dissection was used to mobilize the intraperitoneal contents.  The round ligament was ligated and divided.  There was a small opening in the peritoneum at the level of the ovary and this was closed with a 3-0 Vicryl suture.  A blunt dissection was used to continue to mobilize over to the level of the midline and the anterior surface of the disk was exposed.  Middle sacral vessels were ligated with Ligaclips and divided. Blunt dissection gave good exposure to the right and left at the  L5-S1 disk.  The brow retractor was then brought onto the field and the reverse 150 blades were positioned to the right and left of the disk. The malleable retractors were used to give superior and inferior exposure.  Lateral C-arm again was brought onto the field with a needle in the disk and this did confirm this was the appropriate disk space. The remainder of the procedure will be dictated as a separate note by Dr. Venita Lick.    Larina Earthly, M.D.    TFE/MEDQ  D:  02/25/2011  T:  02/26/2011  Job:  [id number]  Electronically Signed by Brightyn Mozer M.D. on 03/11/2011 07:46:28 AM

## 2011-04-15 NOTE — Discharge Summary (Signed)
Melissa Barton, Melissa Barton                 ACCOUNT NO.:  [id number]  MEDICAL RECORD NO.:  [id number]  LOCATION:  5013                         FACILITY:  [facility]  PHYSICIAN:  Alvy Beal, MD    DATE OF BIRTH:  1961/01/20  DATE OF ADMISSION:  02/25/2011 DATE OF DISCHARGE:  02/27/2011                              DISCHARGE SUMMARY   ADMISSION DIAGNOSES: 1. Post laminectomy syndrome L5-S1 with degenerative disk disease at     L5-S1. 2. Hypothyroidism. 3. Gastroesophageal reflux disease. 4. Anxiety. 5. Vitamin D deficiency. 6. History of cervical cancer. 7. Previous lumbar decompression, diskectomy with Dr. Franky Macho.  DISCHARGE DIAGNOSES: 1. Post laminectomy syndrome L5-S1 with degenerative disk disease L5-     S1 status post anterior lumbar interbody fusion at L5-S1. 2. Hypothyroidism. 3. Gastroesophageal reflux disease. 4. Anxiety. 5. Vitamin D deficiency. 6. History of cervical cancer.  OPERATIVE PROCEDURE:  Anterior lumbar interbody fusion at L5-S1.  SURGEON:  Alvy Beal, MD  FIRST ASSISTANT:  Norval Gable, PA  APPROACH SURGEON:  Larina Earthly, MD  ANESTHESIA:  General.  CONSULTS:  None.  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  She was diagnosed with an L5-S1 disk herniation with left S1 radiculopathy. In December 2010, she had her first lumbar decompression, diskectomy with Dr. Franky Macho without significant problems.  Postoperatively, the radicular pain and numbness had resolved and she was doing quite well until unfortunately in May 2011 she had a fall which resulted in a complex right foot fracture and ultimately required surgery and since that time her back pain has become progressively worse.  Because of the ongoing nature of her debilitating back pain, she presented to our office for further evaluation and treatment.  MRI dated July 24, 2010 demonstrates 1. At  L5-S1 a previous diskectomy and placement of interbody graft.     The graft appears somewhat flattened and irregular with depression     of the adjacent L5 endplate probably representing subsidence.     Recommend correlation to previous imaging.  Mild left facet     arthrosis.  No neural displacement or encroachment. 2. At L4-5 mild disk bulge with an annular tear of the disk.  Mild     facet arthrosis.  No neural displacement or encroachment. 3. At L2-3 a small central disk protrusion.  No neural displacement or     encroachment.  Despite attempts at conservative management including physical therapy, oral medications, injection therapy, activity modification, and observation, Melissa Barton continued to have severe debilitating back pain and elected to proceed with surgery.  HOSPITAL COURSE:  On February 25, 2011 the patient underwent the above procedures without complications.  The patient's condition following the procedure was stable.  She was transferred to the PACU and subsequently to the orthopedic floor.  Postop day #1, the patient was doing well.  Her pain level was tolerable.  Her neurovascular status was intact.  The wound was clean, dry, and intact.  Compartments were soft and nontender.  She was progressing on schedule and without difficulty with inpatient physical therapy and occupational therapy.  Postop day #2, the patient was without complaint.  She was voiding on her own.  She is moving her bowels without difficulty.  She was tolerating a regular diet.  Her pain was adequately controlled with oral pain medications.  She had been cleared by Physical Therapy.  DISCHARGE PLAN:  [facility].  DISCHARGE DIAGNOSES:  Please see above.  DISCHARGE MEDICATIONS:  New medications include: 1. Colace 100 mg. 2. Lovenox 40 mg. 3. Milk of magnesia. 4. Percocet 10/325. 5. Robaxin 500 mg. 6. Zofran 4 mg.  Continued medications from  home at the time of discharge include: 1. Alprazolam 0.25 mg. 2. Carafate 1 gram. 3. Celexa 20 mg. 4. Flonase. 5. Levothroid 75 mcg. 6. Lisinopril 20 mg. 7. Midrin. 8. Protonix 40 mg. 9. Simvastatin 40 mg. 10.Vitamin D 2000 units.  The patient was instructed to stop taking the following medications: 1. Tramadol 50 mg. 2. Norco 5/325 mg.  Discharge instructions were provided to the patient at the time of discharge.  Diet regular.  Activity per protocol.  Follow up 2 [facility] with [facility].  All the patient's questions were encouraged, addressed, and answered. The patient's condition at the time of discharge was considered stable and in good condition.    ______________________________ Norval Gable, PA   ______________________________ Alvy Beal, MD    DK/MEDQ  D:  04/14/2011  T:  04/14/2011  Job:  [id number]  Electronically Signed by Norval Gable PA on 04/15/2011 06:56:07 AM Electronically Signed by Venita Lick MD on 04/15/2011 07:30:19 AM

## 2011-05-24 DIAGNOSIS — D039 Melanoma in situ, unspecified: Secondary | ICD-10-CM | POA: Insufficient documentation

## 2011-06-01 ENCOUNTER — Encounter (INDEPENDENT_AMBULATORY_CARE_PROVIDER_SITE_OTHER): Payer: Self-pay | Admitting: *Deleted

## 2011-06-09 ENCOUNTER — Ambulatory Visit (INDEPENDENT_AMBULATORY_CARE_PROVIDER_SITE_OTHER): Payer: Managed Care, Other (non HMO) | Admitting: Internal Medicine

## 2011-07-27 ENCOUNTER — Other Ambulatory Visit (INDEPENDENT_AMBULATORY_CARE_PROVIDER_SITE_OTHER): Payer: Managed Care, Other (non HMO)

## 2011-07-27 ENCOUNTER — Encounter: Payer: Self-pay | Admitting: Internal Medicine

## 2011-07-27 ENCOUNTER — Ambulatory Visit (INDEPENDENT_AMBULATORY_CARE_PROVIDER_SITE_OTHER): Payer: Managed Care, Other (non HMO) | Admitting: Internal Medicine

## 2011-07-27 ENCOUNTER — Other Ambulatory Visit: Payer: Self-pay | Admitting: Internal Medicine

## 2011-07-27 VITALS — BP 132/78 | HR 72 | Temp 98.2°F | Ht 64.0 in | Wt 202.2 lb

## 2011-07-27 DIAGNOSIS — E785 Hyperlipidemia, unspecified: Secondary | ICD-10-CM

## 2011-07-27 DIAGNOSIS — F329 Major depressive disorder, single episode, unspecified: Secondary | ICD-10-CM | POA: Insufficient documentation

## 2011-07-27 DIAGNOSIS — E039 Hypothyroidism, unspecified: Secondary | ICD-10-CM

## 2011-07-27 DIAGNOSIS — Z23 Encounter for immunization: Secondary | ICD-10-CM

## 2011-07-27 DIAGNOSIS — D039 Melanoma in situ, unspecified: Secondary | ICD-10-CM

## 2011-07-27 DIAGNOSIS — I10 Essential (primary) hypertension: Secondary | ICD-10-CM | POA: Insufficient documentation

## 2011-07-27 DIAGNOSIS — M5136 Other intervertebral disc degeneration, lumbar region: Secondary | ICD-10-CM | POA: Insufficient documentation

## 2011-07-27 DIAGNOSIS — Q231 Congenital insufficiency of aortic valve: Secondary | ICD-10-CM | POA: Insufficient documentation

## 2011-07-27 DIAGNOSIS — K219 Gastro-esophageal reflux disease without esophagitis: Secondary | ICD-10-CM | POA: Insufficient documentation

## 2011-07-27 DIAGNOSIS — F32A Depression, unspecified: Secondary | ICD-10-CM | POA: Insufficient documentation

## 2011-07-27 DIAGNOSIS — J309 Allergic rhinitis, unspecified: Secondary | ICD-10-CM | POA: Insufficient documentation

## 2011-07-27 DIAGNOSIS — G894 Chronic pain syndrome: Secondary | ICD-10-CM | POA: Insufficient documentation

## 2011-07-27 DIAGNOSIS — C439 Malignant melanoma of skin, unspecified: Secondary | ICD-10-CM

## 2011-07-27 DIAGNOSIS — G43909 Migraine, unspecified, not intractable, without status migrainosus: Secondary | ICD-10-CM

## 2011-07-27 DIAGNOSIS — R5382 Chronic fatigue, unspecified: Secondary | ICD-10-CM

## 2011-07-27 LAB — LIPID PANEL
HDL: 47.1 mg/dL (ref >39.00)
Total CHOL/HDL Ratio: 5
VLDL: 25.4 mg/dL (ref 0.0–40.0)

## 2011-07-27 LAB — TSH: TSH: 4.35 u[IU]/mL (ref 0.35–5.50)

## 2011-07-27 MED ORDER — AMITRIPTYLINE HCL 10 MG PO TABS: 10.0000 mg | ORAL_TABLET | Freq: Every day | ORAL | Status: DC

## 2011-07-27 NOTE — Progress Notes (Signed)
Subjective:    Patient ID: Melissa Barton, female    DOB: [birth year], 50 y.o.   MRN: [id number]  HPI  New patient to me, here today to establish care  Complains of ongoing migraine headache for last 48 hours History of chronic headaches, usually improved with Midrin No change today in chronic headache symptoms. Describes as tension across bilateral frontal region radiating to back of head and neck. No visual aura but associated with nausea. Has not yet taken Midrin until evaluation by primary today. No seizures, weakness, fever or numbness. MRI brain October 2010 with benign changes soft tissue predominance near anterior inferior clivus  Also reviewed chronic medical issues: Dyslipidemia. Prescribed simvastatin spring 2012 during cardiac clearance for back surgery. Has not taken for the last 2 months. Question if continued need for prescription  Hypothyroidism. Reports variable compliance. Would like not to take these medications if not needed. Denies bowel changes, skin changes or weight changes  Chronic fatigue syndrome. Negative autoimmune workup 2012 by primary care physician. Describes his overwhelming fatigue. Reports different than depression symptoms for which she takes Celexa. Not improved with gabapentin  Hypertension. Noncompliance with prescribed ACE inhibitor. Denies adverse side effects related to medication but wishes to take fewer pills. Feels elevated blood pressure related to "whitecoat syndrome"  Chronic low back pain. Status post ALIF surgery by Dr. Shon Baton summer 2012. Prior lumbar laminectomy in 2010 with discectomy offered incomplete relief.  Past Medical History  Diagnosis Date  . DDD (degenerative disc disease), lumbar   . Depression   . GERD (gastroesophageal reflux disease)   . Hyperlipidemia   . Hypertension   . Hypothyroid   . Migraines   . Allergic rhinitis, cause unspecified   . Chronic fatigue syndrome   . Bicuspid aortic valve   . Chronic pain  syndrome     headaches, neck and back   Family History  Problem Relation Age of Onset  . Arthritis Mother   . Pancreatic cancer Mother   . Arthritis Father   . Alcohol abuse Other   . Arthritis Other   . Hyperlipidemia Other   . Hypertension Other   . Diabetes Other    History  Substance Use Topics  . Smoking status: Current Everyday Smoker  . Smokeless tobacco: Not on file  . 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  Respiratory: Negative for cough and shortness of breath.   Cardiovascular: Negative for chest pain or palpitations.  Gastrointestinal: Negative for abdominal pain, no bowel changes.  Musculoskeletal: Negative for gait problem or joint swelling.  Skin: Negative for rash.  Neurological: Negative for dizziness or seizures.  No other specific complaints in a complete review of systems (except as listed in HPI above).     Objective:   Physical Exam BP 132/78  Pulse 72  Temp(Src) 98.2 F (36.8 C) (Oral)  Ht 5\' 4"  (1.626 m)  Wt 202 lb 3.2 oz (91.717 kg)  BMI 34.71 kg/m2  SpO2 97% Wt Readings from Last 3 Encounters:  07/27/11 202 lb 3.2 oz (91.717 kg)  12/23/09 196 lb (88.905 kg)   Constitutional: She is overweight but appears well-developed and well-nourished. No distress.  HENT: Head: Normocephalic and atraumatic. Ears: B TMs ok, no erythema or effusion; Nose: Nose normal.  Mouth/Throat: Oropharynx is clear and moist. No oropharyngeal exudate.  Eyes: Conjunctivae and EOM are normal. Pupils  are equal, round, and reactive to light. No scleral icterus.  Neck: Normal range of motion. Neck supple. No JVD present. No thyromegaly present.  Cardiovascular: Normal rate, regular rhythm and normal heart sounds.  No murmur heard. No BLE edema. Pulmonary/Chest: Effort normal and breath sounds normal. No respiratory distress. She has  no wheezes.  Abdominal: Soft. Bowel sounds are normal. She exhibits no distension. There is no tenderness. no masses Musculoskeletal: Normal range of motion, no joint effusions. No gross deformities Neurological: She is alert and oriented to person, place, and time. No cranial nerve deficit. Coordination normal.  Skin: Skin is warm and dry. No rash noted. No erythema.  Psychiatric: She has a normal mood and affect. Her behavior is normal. Judgment and thought content normal.   Lab Results  Component Value Date   WBC 8.5 02/22/2011   HGB 13.6 02/22/2011   HCT 38.2 02/22/2011   PLT 280 02/22/2011   GLUCOSE 87 02/22/2011   CHOL 230* 07/27/2011   TRIG 127.0 07/27/2011   HDL 47.10 07/27/2011   LDLDIRECT 163.0 07/27/2011   NA 137 02/22/2011   K 4.1 02/22/2011   CL 99 02/22/2011   CREATININE 0.63 02/22/2011   BUN 13 02/22/2011   CO2 28 02/22/2011   TSH 4.35 07/27/2011       Assessment & Plan:  See problem list. Medications and labs reviewed today.

## 2011-07-27 NOTE — Patient Instructions (Signed)
It was good to see you today. We have reviewed your prior records including labs and tests today Test(s) ordered today. Your results will be called to you after review (48-72hours after test completion). If any changes need to be made, you will be notified at that time. Okay to remain off simvastatin and lisinopril at this time - we will let you know about the thyroid dose Start low-dose amitriptyline at bedtime for migraine prophylaxis - Your prescription(s) have been submitted to your pharmacy. Please take as directed and contact our office if you believe you are having problem(s) with the medication(s). Other medications reviewed and no additional changes at this time Use tramadol as needed in place of the Midrin for migraine pain symptoms while waiting for amitriptyline to take effect Flu shot given today Please schedule followup in 6 weeks for continued review, call sooner if problems.

## 2011-07-28 NOTE — Assessment & Plan Note (Signed)
Check TSH now and adjust Synthroid as needed Lab Results  Component Value Date   TSH 4.35 07/27/2011

## 2011-07-28 NOTE — Assessment & Plan Note (Signed)
Chronic history of same takes Midrin as needed for severe symptoms Increase symptoms with stress We'll start amitriptyline for prophylaxis to also help poor sleep symptoms Consider neurology referral for consideration of Botox injections or other therapy if unimproved

## 2011-07-28 NOTE — Assessment & Plan Note (Signed)
Began statin spring 2012 do to elevated cholesterol during cardiac clearance for surgery March 2012 cholesterol 235; September 2012 cholesterol 189 on statin treatment; LDL 143 down to 101 However has not been taking statin for last 2 months and wishes not to resume Recheck now and resume if needed

## 2011-07-28 NOTE — Assessment & Plan Note (Signed)
Negative autoimmune workup with unremarkable sedimentation rate, ANA, rheumatoid factor and negative CRP 04/30/2011 Patient inquires as to any local specialists in the treatment of chronic fatigue Will review and consider. For now, continue current therapy and supportive care

## 2011-07-28 NOTE — Assessment & Plan Note (Signed)
Status post Mohs resection October 2012> patient reports surgically clean margins Followup with dermatology every 6 months and as needed

## 2011-07-28 NOTE — Assessment & Plan Note (Signed)
Reports blood pressure well-controlled off ACE inhibitor. Borderline blood pressure today which patient attributes to "white coat"  Okay to DC lisinopril at this time and monitor Plan to resume treatment if needed BP Readings from Last 3 Encounters:  07/27/11 132/78

## 2011-08-13 DIAGNOSIS — Z0279 Encounter for issue of other medical certificate: Secondary | ICD-10-CM

## 2011-09-07 ENCOUNTER — Encounter: Payer: Self-pay | Admitting: Internal Medicine

## 2011-09-07 ENCOUNTER — Ambulatory Visit (INDEPENDENT_AMBULATORY_CARE_PROVIDER_SITE_OTHER): Payer: Managed Care, Other (non HMO) | Admitting: Internal Medicine

## 2011-09-07 VITALS — BP 126/90 | HR 62 | Temp 98.4°F | Wt 200.1 lb

## 2011-09-07 DIAGNOSIS — B370 Candidal stomatitis: Secondary | ICD-10-CM

## 2011-09-07 DIAGNOSIS — I10 Essential (primary) hypertension: Secondary | ICD-10-CM

## 2011-09-07 DIAGNOSIS — E785 Hyperlipidemia, unspecified: Secondary | ICD-10-CM

## 2011-09-07 DIAGNOSIS — K219 Gastro-esophageal reflux disease without esophagitis: Secondary | ICD-10-CM

## 2011-09-07 MED ORDER — TERCONAZOLE 0.8 % VA CREA: 1.0000 | TOPICAL_CREAM | Freq: Every day | VAGINAL | Status: DC

## 2011-09-07 MED ORDER — LISINOPRIL 10 MG PO TABS: 10.0000 mg | ORAL_TABLET | Freq: Every day | ORAL | Status: DC

## 2011-09-07 MED ORDER — NYSTATIN 100000 UNIT/ML MT SUSP: 500000.0000 [IU] | Freq: Four times a day (QID) | OROMUCOSAL | Status: AC

## 2011-09-07 MED ORDER — RABEPRAZOLE SODIUM 20 MG PO TBEC: 20.0000 mg | DELAYED_RELEASE_TABLET | Freq: Every day | ORAL | Status: DC

## 2011-09-07 NOTE — Patient Instructions (Addendum)
It was good to see you today. Resume lisinopril at 10mg  dose Nystatin suspension and Terazol for yeast symptoms -  Change generic protonix to alternative PPI - try achiphex - Your prescription(s) have been submitted to your pharmacy. Please take as directed and contact our office if you believe you are having problem(s) with the medication(s). Refill on medication(s) as discussed today. No other medication changes today Please schedule followup in 3-4 months, call sooner if problems.

## 2011-09-07 NOTE — Progress Notes (Signed)
Subjective:    Patient ID: Melissa Barton, female    DOB: 1961/06/28, 51 y.o.   MRN: [id number]  HPI  Here for follow up reviewed chronic medical issues:  GERD - incomplete relief of "esophageal and stomach burning" with current tx (pantopro bid and carafate qid) - prev on nexium (changed PPI d/t incomplete relief) - EGD done [facility] 01/2011 - no nausea and vomiting,no bowel changes or melena  migraine headache -started Elavil for same 07/2011 History of chronic headaches, usually improved with Midrin No change in chronic headache symptoms.  MRI brain October 2010 with benign changes, soft tissue predominance near anterior inferior clivus  Dyslipidemia. Prescribed simvastatin spring 2012 during cardiac clearance for back surgery. Resumed same 07/2011. the patient reports compliance with medication(s) as prescribed. Denies adverse side effects.   Hypothyroidism. Reports variable compliance. Would like not to take these medications if not needed. Denies bowel changes, skin changes or weight changes  Chronic fatigue syndrome. Negative autoimmune workup 2012 by previous primary care physician. Describes overwhelming fatigue. Reports different than depression symptoms for which she takes Celexa. Not improved with gabapentin  Hypertension. prescribed ACE inhibitor. Denies adverse side effects related to medication but wishesd to take fewer pills so same on hold since 07/2011. Feels elevated blood pressure related to "whitecoat syndrome" but willing to resume same if needed  Chronic low back pain. Status post ALIF surgery by Dr. Shon Baton summer 2012. Prior lumbar laminectomy in 2010 with discectomy offered incomplete relief. Ongoing cervical evaluation because of LUE numbness/pain  Past Medical History  Diagnosis Date  . DDD (degenerative disc disease), lumbar   . Depression   . GERD (gastroesophageal reflux disease)   . Hyperlipidemia   . Hypertension   . Hypothyroid   . Migraines   . Allergic  rhinitis, cause unspecified   . Chronic fatigue syndrome   . Bicuspid aortic valve   . Chronic pain syndrome     headaches, neck and back    History  Substance Use Topics  . Smoking status: Current Everyday Smoker  . Smokeless tobacco: Not on file  . 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  Cardiovascular: Negative for chest pain or palpitations.  Gastrointestinal: Negative for melena or bowel changes.  Neurological: Negative for dizziness or seizures.       Objective:   Physical Exam  BP 126/90  Pulse 62  Temp(Src) 98.4 F (36.9 C) (Oral)  Wt 200 lb 1.9 oz (90.774 kg)  SpO2 98% Wt Readings from Last 3 Encounters:  09/07/11 200 lb 1.9 oz (90.774 kg)  07/27/11 202 lb 3.2 oz (91.717 kg)  12/23/09 196 lb (88.905 kg)   Constitutional: She is overweight but appears well-developed and well-nourished. No distress.  HENT: Head: Normocephalic and atraumatic. Ears: B TMs ok, no erythema or effusion; Nose: Nose normal.  Mouth/Throat: Oropharynx is moderately red with "beefy tongue" and moist. No oropharyngeal exudate.  Eyes: Conjunctivae and EOM are normal. Pupils are equal, round, and reactive to light. No scleral icterus.  Neck: Normal range of motion. Neck supple. No JVD present. No thyromegaly present.  Cardiovascular: Normal rate, regular rhythm and normal heart sounds.  No murmur heard. No BLE edema. Pulmonary/Chest: Effort normal and breath sounds normal. No respiratory distress. She has no wheezes.  Abdominal: Soft. Bowel sounds are normal. She exhibits no distension. There is no tenderness. no  masses Psychiatric: She has a normal mood and affect. Her behavior is normal. Judgment and thought content normal.   Lab Results  Component Value Date   WBC 8.5 02/22/2011   HGB 13.6 02/22/2011   HCT 38.2 02/22/2011   PLT 280 02/22/2011    GLUCOSE 87 02/22/2011   CHOL 230* 07/27/2011   TRIG 127.0 07/27/2011   HDL 47.10 07/27/2011   LDLDIRECT 163.0 07/27/2011   NA 137 02/22/2011   K 4.1 02/22/2011   CL 99 02/22/2011   CREATININE 0.63 02/22/2011   BUN 13 02/22/2011   CO2 28 02/22/2011   TSH 4.35 07/27/2011       Assessment & Plan:  See problem list. Medications and labs reviewed today.  Candidiasis - mouth + vaginal symptoms - nystatin susp and topical antifungal chosen due to co-tx statin (avoid diflucan if possible) - now off pred/abx and no other major risks for recurrent symptoms - pt will call if symptoms worse/unimproved for refer to GI as needed

## 2011-09-08 MED ORDER — SIMVASTATIN 40 MG PO TABS: 20.0000 mg | ORAL_TABLET | Freq: Every day | ORAL | Status: DC

## 2011-09-08 NOTE — Assessment & Plan Note (Signed)
Incomplete relief - prior EGD 01/2011 at [facility] report unremarkable (per pt) Prev nexium rx and increase to BID ineffective - change PPI pantopro to Achiphex (or other preferred rx) and continue carafate - new erx done recommended follow up GI if persisting or worsening symptoms

## 2011-09-08 NOTE — Assessment & Plan Note (Signed)
Began statin spring 2012 because elevated cholesterol during cardiac clearance for back surgery Resumed same 07/2011 because of increase trend cholesterol off statin tx - refills done today

## 2011-09-08 NOTE — Assessment & Plan Note (Signed)
Trial off ACE inhibitor 07/2011. Borderline blood pressure today - resume lisinopril (lower dose) at this time and monitor  BP Readings from Last 3 Encounters:  09/07/11 126/90  07/27/11 132/78

## 2011-11-18 ENCOUNTER — Other Ambulatory Visit: Payer: Self-pay | Admitting: Internal Medicine

## 2011-12-08 ENCOUNTER — Ambulatory Visit: Payer: Managed Care, Other (non HMO) | Admitting: Internal Medicine

## 2011-12-10 IMAGING — CR DG CHEST 1V PORT
1 series · 1 of 1 positions shown · non-contrast
Comparison: PA and lateral chest 02/22/2011.

CLINICAL DATA: Central line placement.

PORTABLE CHEST - 1 VIEW

[AP]
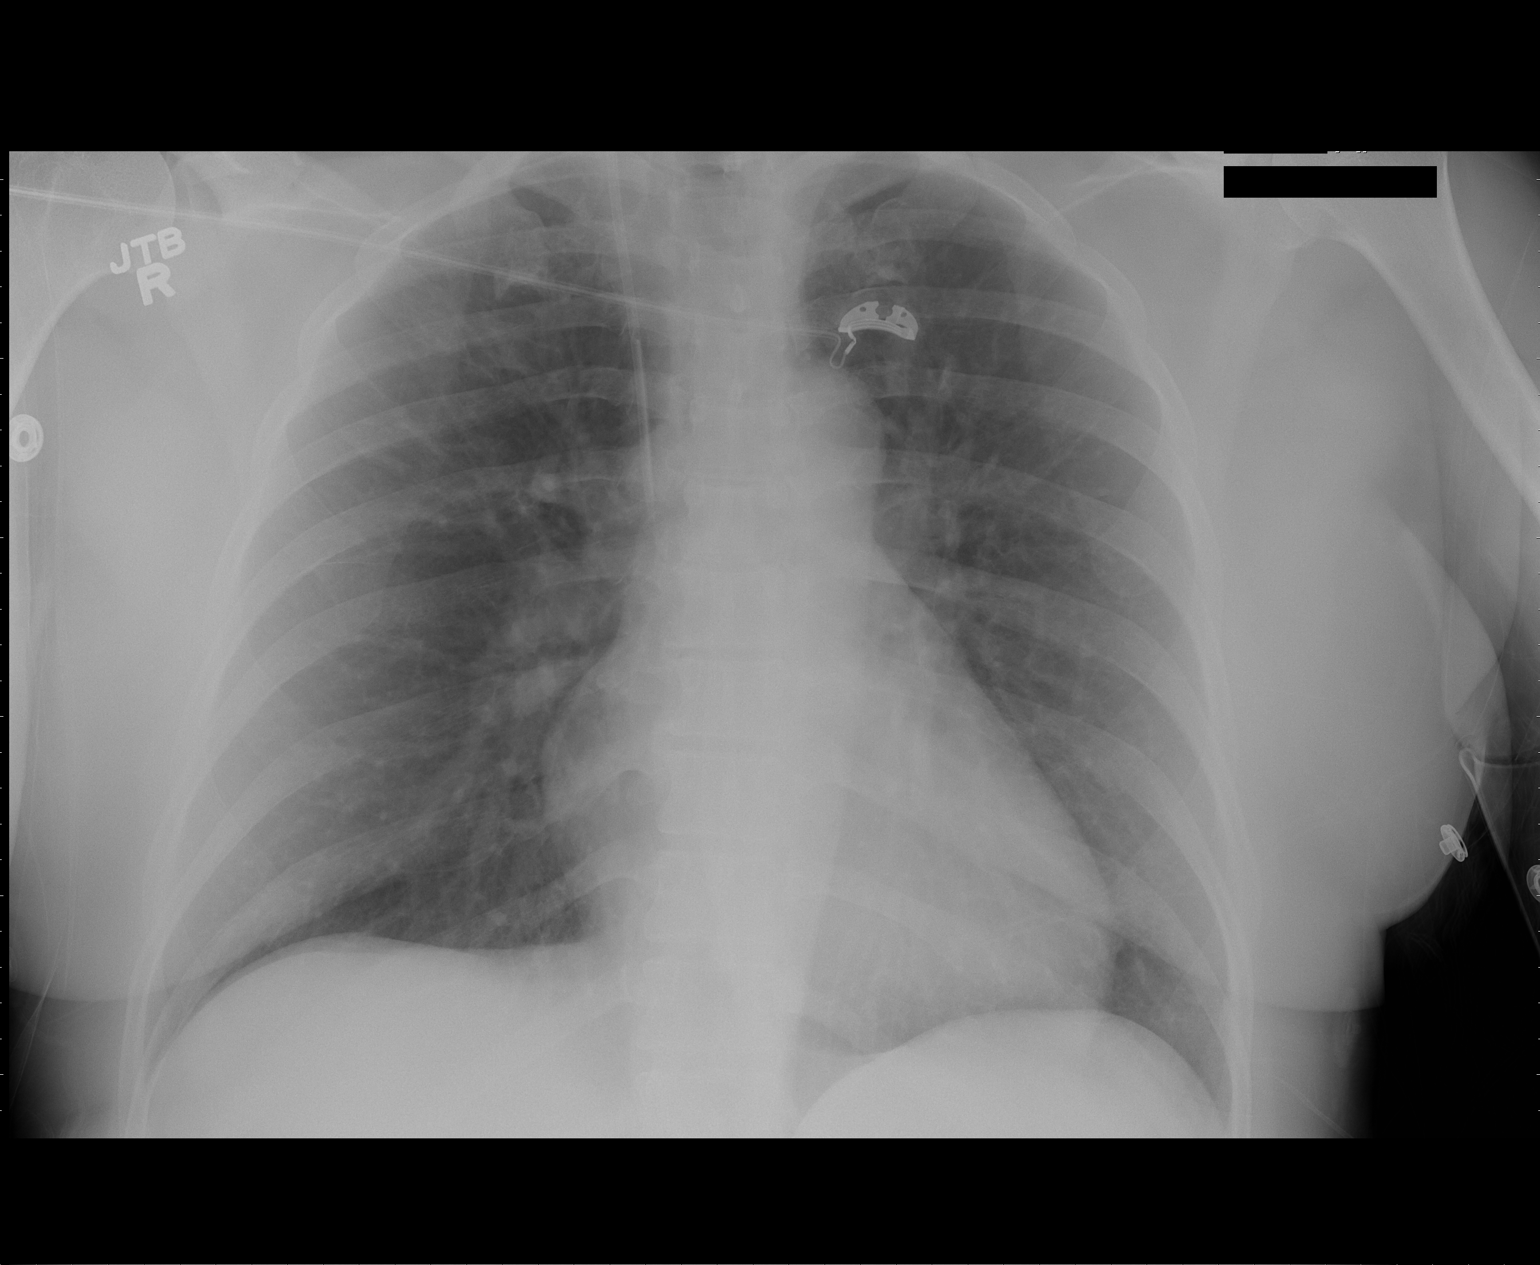

[1 of 1 positions shown; findings below may reference images not displayed]

FINDINGS: The patient has a new right IJ central venous catheter.
The tip of the catheter is in the lower superior vena cava.  No
pneumothorax.  Lungs clear.  No pleural effusion.  Heart size upper
normal.
IMPRESSION: Right IJ catheter in good position.  No acute finding.

## 2011-12-10 IMAGING — RF DG LUMBAR SPINE 2-3V
1 series · 2 of 2 positions shown · non-contrast
Comparison: February 22, 2011

CLINICAL DATA: L5-S1 A L I F

LUMBAR SPINE - 2-3 VIEW

[Series 1: run · 2 of 2 slices shown]
[im 1/2]
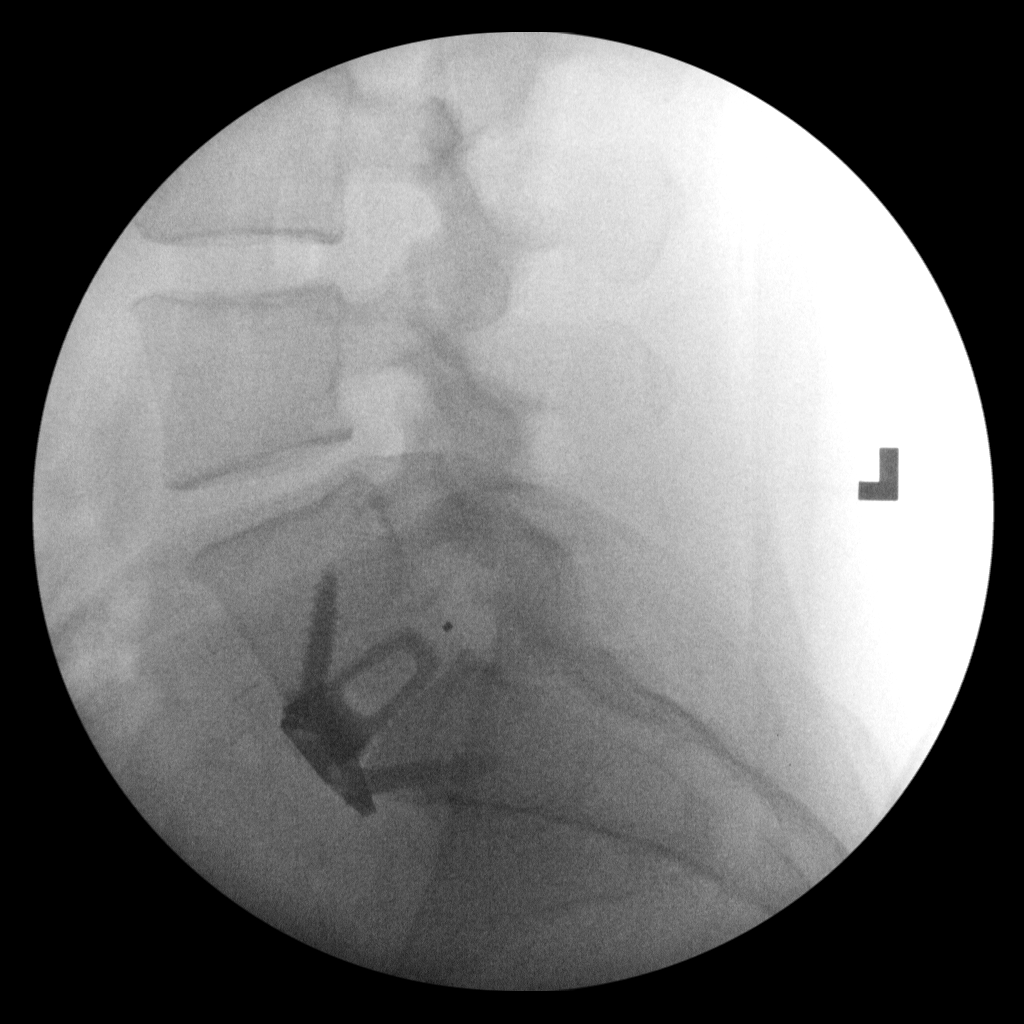
[im 2/2]
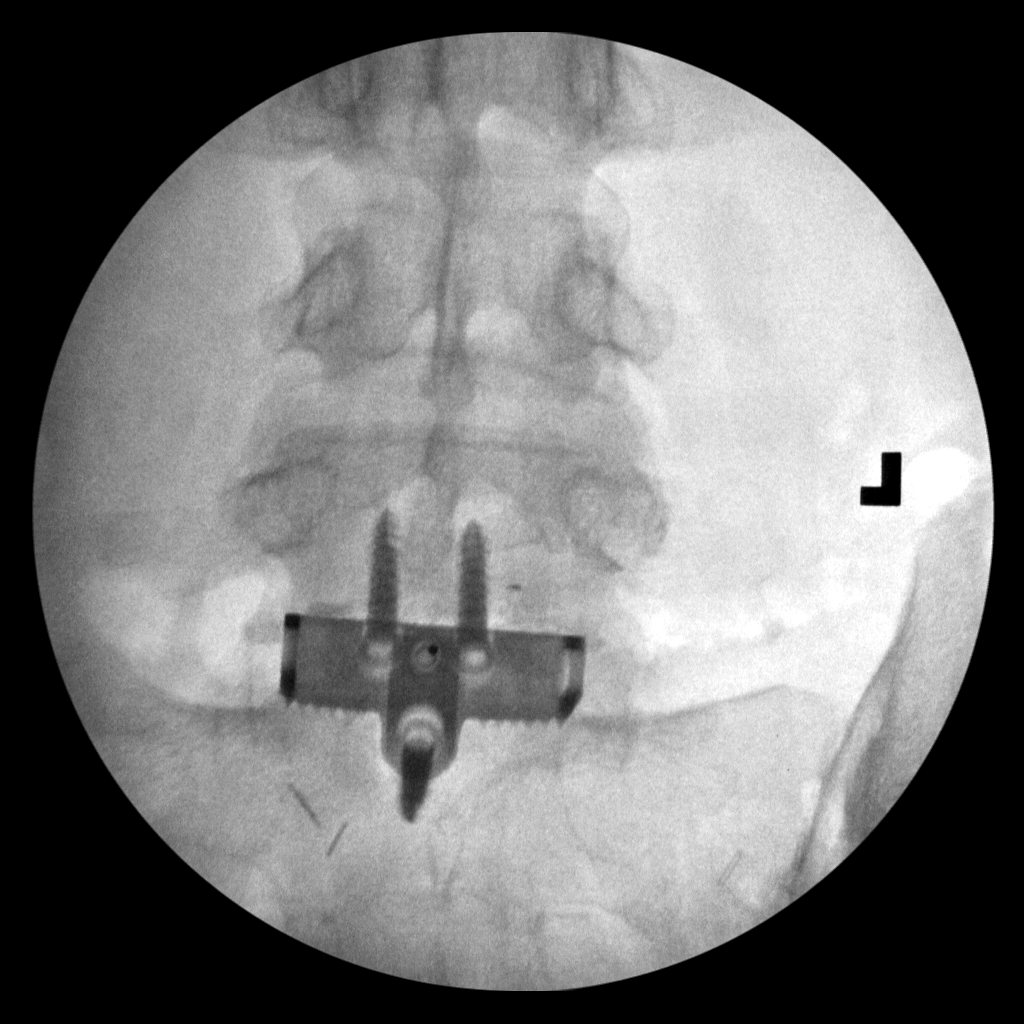

[2 of 2 positions shown; findings below may reference images not displayed]

FINDINGS: There is a surgical fusion at L5-S1 now.  There is no
peri hardware lucency.  The lateral alignment remains normal.
IMPRESSION: Postoperative changes at L5-S1 with no evidence of hardware
complication.

## 2011-12-10 IMAGING — CR DG OR LOCAL ABDOMEN
2 series · 4 of 4 positions shown · non-contrast
Comparison: February 22, 2011

CLINICAL DATA: Evaluate for foreign body/instrument; L5-S1 A L I F

OR LOCAL ABDOMEN

[Series 1: AP · 0.16mm/px · 2 of 2 slices shown (1 of 2)]
[im 1/2]
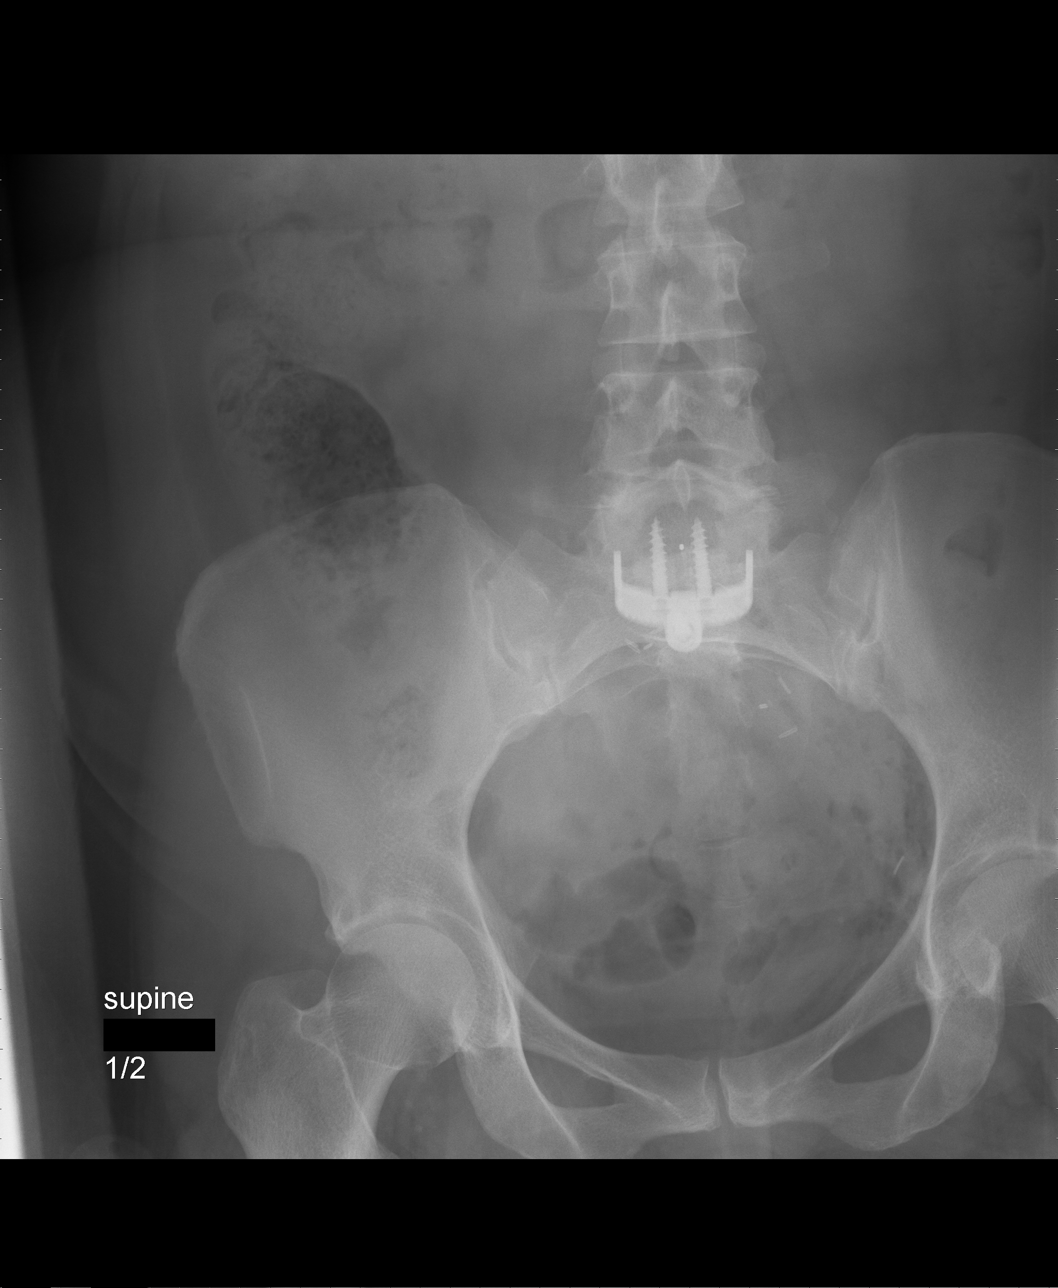
[im 2/2]
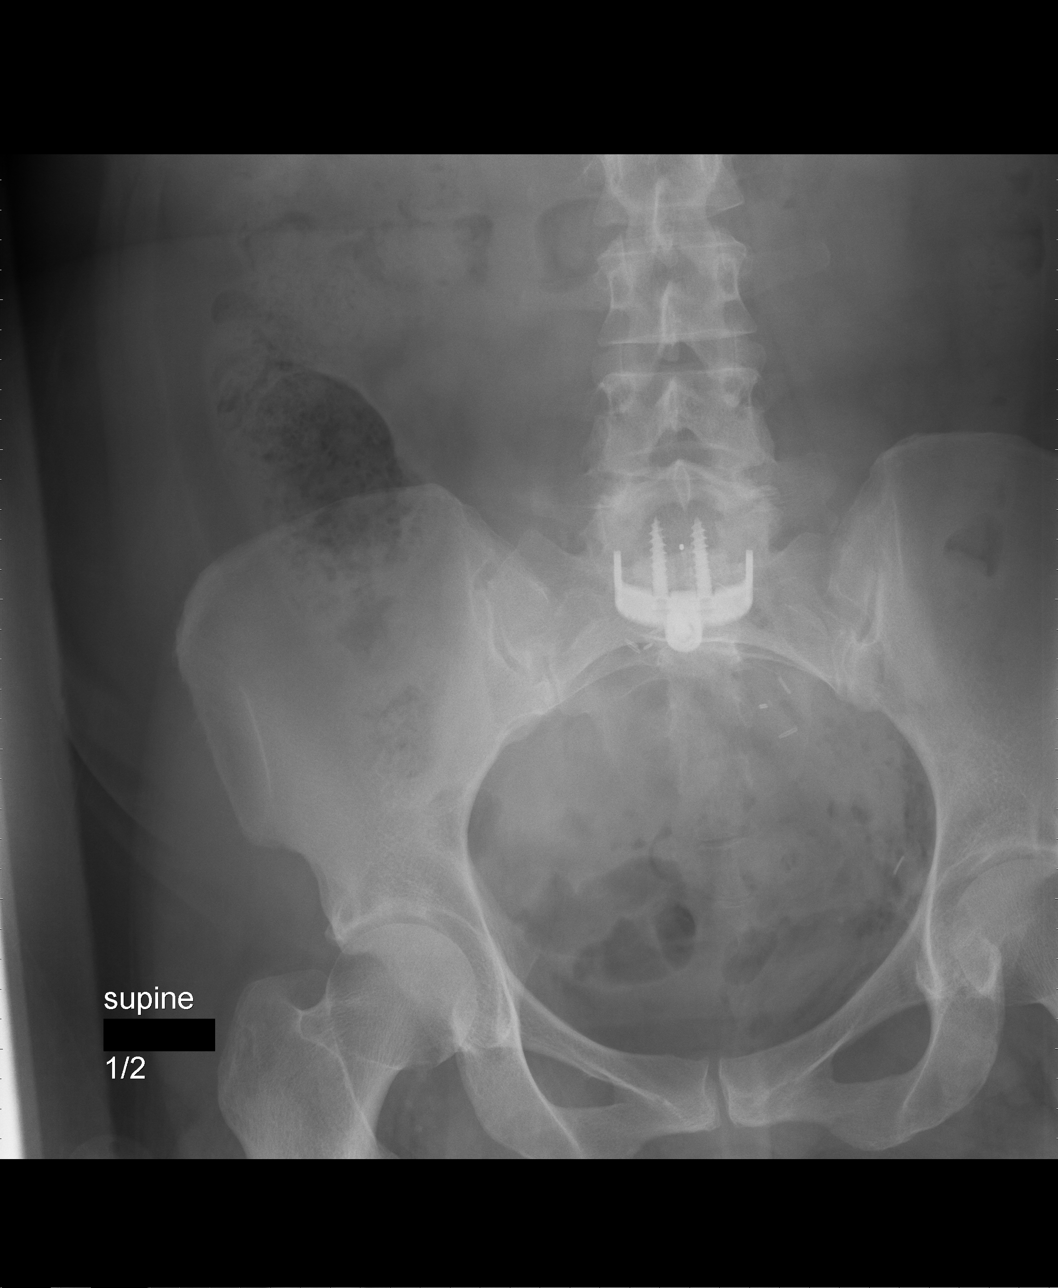

[Series 2: AP · 0.16mm/px · 2 of 2 slices shown (2 of 2)]
[im 1/2]
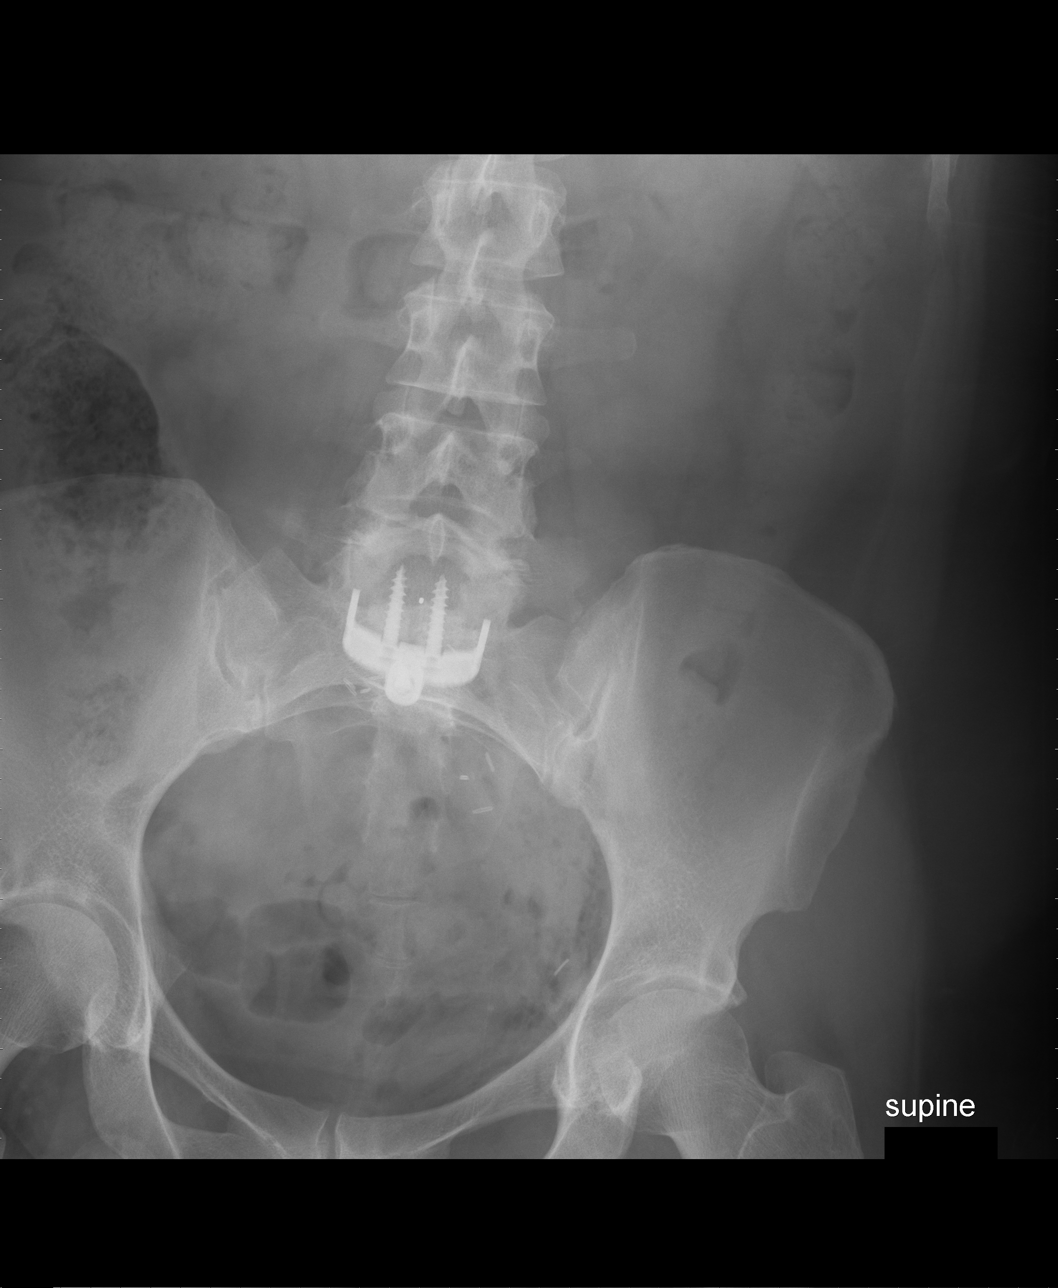
[im 2/2]
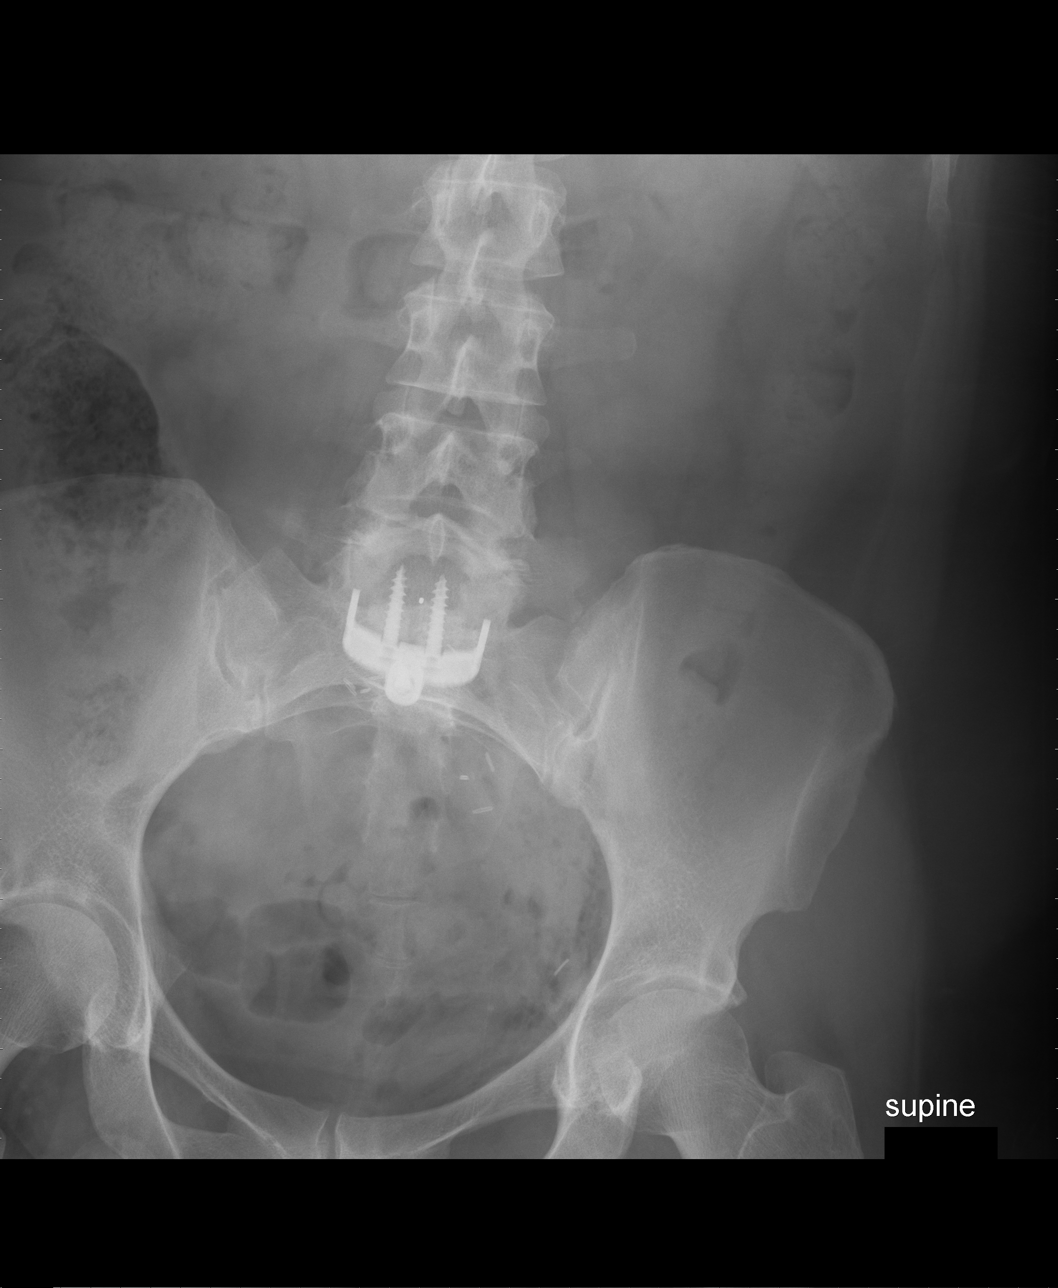

[4 of 4 positions shown; findings below may reference images not displayed]

FINDINGS: There is surgical hardware now at L5-S1.  Surgical clips
are also noted in the region.  No unexpected radiopaque foreign
bodies are identified.
IMPRESSION: No unexpected radiopaque foreign bodies.

## 2011-12-25 ENCOUNTER — Other Ambulatory Visit: Payer: Self-pay | Admitting: Internal Medicine

## 2012-01-05 ENCOUNTER — Ambulatory Visit: Payer: Managed Care, Other (non HMO) | Admitting: Internal Medicine

## 2012-01-05 ENCOUNTER — Encounter: Payer: Self-pay | Admitting: Internal Medicine

## 2012-01-05 ENCOUNTER — Ambulatory Visit (INDEPENDENT_AMBULATORY_CARE_PROVIDER_SITE_OTHER): Payer: Managed Care, Other (non HMO) | Admitting: Internal Medicine

## 2012-01-05 VITALS — BP 102/62 | HR 72 | Temp 98.0°F | Ht 64.0 in | Wt 196.8 lb

## 2012-01-05 DIAGNOSIS — E039 Hypothyroidism, unspecified: Secondary | ICD-10-CM

## 2012-01-05 DIAGNOSIS — M797 Fibromyalgia: Secondary | ICD-10-CM

## 2012-01-05 DIAGNOSIS — R5382 Chronic fatigue, unspecified: Secondary | ICD-10-CM

## 2012-01-05 DIAGNOSIS — G894 Chronic pain syndrome: Secondary | ICD-10-CM

## 2012-01-05 DIAGNOSIS — IMO0001 Reserved for inherently not codable concepts without codable children: Secondary | ICD-10-CM

## 2012-01-05 MED ORDER — DULOXETINE HCL 30 MG PO CPEP: 30.0000 mg | ORAL_CAPSULE | Freq: Every day | ORAL | Status: DC

## 2012-01-05 MED ORDER — ALPRAZOLAM 0.25 MG PO TABS: 0.2500 mg | ORAL_TABLET | Freq: Two times a day (BID) | ORAL | Status: DC | PRN

## 2012-01-05 MED ORDER — GABAPENTIN 100 MG PO CAPS: 100.0000 mg | ORAL_CAPSULE | Freq: Three times a day (TID) | ORAL | Status: DC

## 2012-01-05 NOTE — Progress Notes (Signed)
Subjective:    Patient ID: Melissa Barton, female    DOB: May 28, 1961, 51 y.o.   MRN: [id number]  HPI  Here for follow up - rheum Nickola Major) feels pt has fibromyalgia but recommended to pt tx by PCP for same  Also reviewed chronic medical issues:  GERD - incomplete relief of "esophageal and stomach burning" with current tx - prior tx pantopro bid, carafate qid, nexium - EGD done [facility] 01/2011 - no nausea and vomiting,no bowel changes or melena  migraine headache -started Elavil for same 07/2011 History of chronic headaches, usually improved with Midrin No change in chronic headache symptoms.  MRI brain October 2010 with benign changes, soft tissue predominance near anterior inferior clivus  Dyslipidemia. Prescribed simvastatin spring 2012 during cardiac clearance for back surgery. Resumed same 07/2011. the patient reports compliance with medication(s) as prescribed. Denies adverse side effects.   Hypothyroidism. Reports variable compliance. Would like not to take these medications if not needed. Denies bowel changes, skin changes or weight changes  Depression - on celexa since 2010 - no flares of mood, ?change med for FM  Chronic fatigue syndrome. Negative autoimmune workup 2012 by previous primary care physician and rheum eval Nickola Major). Describes overwhelming fatigue. Reports different than depression symptoms. Not improved with gabapentin - takes only low dose qhs due to sedation side effects   Hypertension. On ACE inhibitor. Denies adverse side effects related to medication but wishes to take fewer pills as feels elevated blood pressure related to "whitecoat syndrome"  Chronic low back pain. Takes norco TID for same. Status post ALIF surgery by Dr. Shon Baton summer 2012. Prior lumbar laminectomy in 2010 with discectomy offered incomplete relief. Ongoing cervical evaluation with PT because of LUE numbness/pain  Past Medical History  Diagnosis Date  . DDD (degenerative disc disease), lumbar     . Depression   . GERD (gastroesophageal reflux disease)   . Hyperlipidemia   . Hypertension   . Hypothyroid   . Migraines   . Allergic rhinitis, cause unspecified   . Chronic fatigue syndrome   . Bicuspid aortic valve   . 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  Gastrointestinal: Negative for melena or bowel changes.  Neurological: Negative for dizziness or seizures.       Objective:   Physical Exam  BP 102/62  Pulse 72  Temp(Src) 98 F (36.7 C) (Oral)  Ht 5\' 4"  (1.626 m)  Wt 196 lb 12.8 oz (89.268 kg)  BMI 33.78 kg/m2  SpO2 94% Wt Readings from Last 3 Encounters:  01/05/12 196 lb 12.8 oz (89.268 kg)  09/07/11 200 lb 1.9 oz (90.774 kg)  07/27/11 202 lb 3.2 oz (91.717 kg)   Constitutional: She is overweight but appears well-developed and well-nourished. No distress Cardiovascular: Normal rate, regular rhythm and normal heart sounds.  No murmur heard. No BLE edema. Pulmonary/Chest: Effort normal and breath sounds normal. No respiratory distress. She has no wheezes. Psychiatric: She has a dysphoric mood and affect. Her behavior is normal. Judgment, insight and thought content normal.   Lab Results  Component Value Date   WBC 8.5 02/22/2011   HGB 13.6 02/22/2011   HCT 38.2 02/22/2011   PLT 280 02/22/2011   GLUCOSE 87 02/22/2011   CHOL 230* 07/27/2011   TRIG 127.0 07/27/2011  HDL 47.10 07/27/2011   LDLDIRECT 163.0 07/27/2011   NA 137 02/22/2011   K 4.1 02/22/2011   CL 99 02/22/2011   CREATININE 0.63 02/22/2011   BUN 13 02/22/2011   CO2 28 02/22/2011   TSH 4.35 07/27/2011       Assessment & Plan:  See problem list. Medications and labs reviewed today.

## 2012-01-05 NOTE — Assessment & Plan Note (Signed)
The current medical regimen is effective;  continue present plan and medications.  Lab Results  Component Value Date   TSH 4.35 07/27/2011

## 2012-01-05 NOTE — Assessment & Plan Note (Signed)
Negative autoimmune workup with unremarkable sedimentation rate, ANA, rheumatoid factor and negative CRP 04/30/2011 New rheum eval by [facility]>> fibromyalgia See chronic pain for plans on tx same

## 2012-01-05 NOTE — Assessment & Plan Note (Signed)
Working with ortho spine - on hydrocodone TID and ongoing PT S/p rheum eval ([facility]) who feels symptoms related to fibromyalgia  Will start cymbalta and increase neurontin to 100 mg TID sched Continue same low dose elavil follow up 6 weeks

## 2012-01-05 NOTE — Patient Instructions (Signed)
It was good to see you today. Stop celexa and start cymbalta Increase neurontin as discussed and continue xanax Your prescription(s) have been submitted to your pharmacy. Please take as directed and contact our office if you believe you are having problem(s) with the medication(s). Other Medications reviewed, no changes at this time. Please schedule followup in 6-8 weeks, call sooner if problems.

## 2012-02-16 ENCOUNTER — Encounter: Payer: Self-pay | Admitting: Internal Medicine

## 2012-02-16 ENCOUNTER — Ambulatory Visit (INDEPENDENT_AMBULATORY_CARE_PROVIDER_SITE_OTHER): Payer: Managed Care, Other (non HMO) | Admitting: Internal Medicine

## 2012-02-16 VITALS — BP 118/68 | HR 71 | Temp 98.5°F | Ht 64.0 in | Wt 193.4 lb

## 2012-02-16 DIAGNOSIS — M797 Fibromyalgia: Secondary | ICD-10-CM

## 2012-02-16 DIAGNOSIS — M5137 Other intervertebral disc degeneration, lumbosacral region: Secondary | ICD-10-CM

## 2012-02-16 DIAGNOSIS — M5136 Other intervertebral disc degeneration, lumbar region: Secondary | ICD-10-CM

## 2012-02-16 DIAGNOSIS — IMO0001 Reserved for inherently not codable concepts without codable children: Secondary | ICD-10-CM

## 2012-02-16 DIAGNOSIS — I10 Essential (primary) hypertension: Secondary | ICD-10-CM

## 2012-02-16 DIAGNOSIS — F329 Major depressive disorder, single episode, unspecified: Secondary | ICD-10-CM

## 2012-02-16 NOTE — Assessment & Plan Note (Signed)
S/p fusion 2012 - pain not improved Ortho [facility] has referred for pain mgmt - pt looking into [place] for same cymbalta has not helped back pain despite improved FM pain symptoms  Continue pain mgmt as ongoing

## 2012-02-16 NOTE — Assessment & Plan Note (Signed)
Stopped citalopram when changed to cymbalta 12/2011 Using xanax prn = continue same

## 2012-02-16 NOTE — Patient Instructions (Signed)
It was good to see you today. continue cymbalta, neuron tin and xanax as discussed Other Medications reviewed, no changes at this time. Please schedule followup in 6 months for cholesterol and blood pressure check, call sooner if problems.

## 2012-02-16 NOTE — Assessment & Plan Note (Signed)
Trial off ACE inhibitor 07/2011 uncontrolled thus resumed lisinopril (lower dose) 08/2011 The current medical regimen is effective;  continue present plan and medications.  BP Readings from Last 3 Encounters:  02/16/12 118/68  01/05/12 102/62  09/07/11 126/90

## 2012-02-16 NOTE — Assessment & Plan Note (Signed)
Started cymbalta 12/2011 for same and neuronin 300 qhs - doing well The current medical regimen is effective;  continue present plan and medications.

## 2012-02-16 NOTE — Progress Notes (Signed)
Subjective:    Patient ID: Melissa Barton, female    DOB: July 16, 1961, 51 y.o.   MRN: [id number]  HPI  Here for follow up -began cymbalta for fibromyalgia 12/2011  Also reviewed chronic medical issues:  GERD -occassional "esophageal and stomach burning" but stable - prior tx pantopro bid, carafate qid, nexium - EGD done [facility] 01/2011 - no nausea and vomiting, no bowel changes or melena - on Aciphex since spring 2013 and doing well  migraine headache -started Elavil for same 07/2011 History of chronic headaches, usually improved with Midrin No change in chronic headache symptoms.  MRI brain October 2010 with benign changes, soft tissue predominance near anterior inferior clivus  Dyslipidemia. Prescribed simvastatin spring 2012 during cardiac clearance for back surgery. Resumed same 07/2011. the patient reports compliance with medication(s) as prescribed. Denies adverse side effects.   Hypothyroidism. Reports variable compliance. Would like not to take these medications if not needed. Denies bowel changes, skin changes or weight changes  Depression - on celexa since 2010 - no flares of mood, ?change med for FM  Chronic fatigue syndrome. Negative autoimmune workup 2012 by previous primary care physician and rheum eval Nickola Major). Describes overwhelming fatigue. Reports different than depression symptoms. Not improved with gabapentin - takes only low dose qhs due to sedation side effects   Hypertension. On ACE inhibitor. Denies adverse side effects related to medication but wishes to take fewer pills as feels elevated blood pressure related to "whitecoat syndrome"  Chronic low back pain. Takes norco TID for same. Status post ALIF surgery by Dr. Shon Baton summer 2012. Prior lumbar laminectomy in 2010 with discectomy offered incomplete relief. Ongoing cervical evaluation with PT because of LUE numbness/pain  Past Medical History  Diagnosis Date  . DDD (degenerative disc disease), lumbar   .  Depression   . GERD (gastroesophageal reflux disease)   . Hyperlipidemia   . Hypertension   . Hypothyroid   . Migraines   . Allergic rhinitis, cause unspecified   . Chronic fatigue syndrome   . Bicuspid aortic valve   . 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  Gastrointestinal: Negative for melena or bowel changes.  Neurological: Negative for dizziness or seizures.       Objective:   Physical Exam  BP 118/68  Pulse 71  Temp 98.5 F (36.9 C) (Oral)  Ht 5\' 4"  (1.626 m)  Wt 193 lb 6.4 oz (87.726 kg)  BMI 33.20 kg/m2  SpO2 95% Wt Readings from Last 3 Encounters:  02/16/12 193 lb 6.4 oz (87.726 kg)  01/05/12 196 lb 12.8 oz (89.268 kg)  09/07/11 200 lb 1.9 oz (90.774 kg)   Constitutional: She is overweight but appears well-developed and well-nourished. No distress Cardiovascular: Normal rate, regular rhythm and normal heart sounds.  No murmur heard. No BLE edema. Pulmonary/Chest: Effort normal and breath sounds normal. No respiratory distress. She has no wheezes. Psychiatric: She has a flat but brighter than previous visits mood and affect. Her behavior is normal. Judgment, insight and thought content normal.   Lab Results  Component Value Date   WBC 8.5 02/22/2011   HGB 13.6 02/22/2011   HCT 38.2 02/22/2011   PLT 280 02/22/2011   GLUCOSE 87 02/22/2011   CHOL 230* 07/27/2011   TRIG 127.0 07/27/2011  HDL 47.10 07/27/2011   LDLDIRECT 163.0 07/27/2011   NA 137 02/22/2011   K 4.1 02/22/2011   CL 99 02/22/2011   CREATININE 0.63 02/22/2011   BUN 13 02/22/2011   CO2 28 02/22/2011   TSH 4.35 07/27/2011       Assessment & Plan:  See problem list. Medications and labs reviewed today.

## 2012-02-22 ENCOUNTER — Other Ambulatory Visit: Payer: Self-pay | Admitting: Internal Medicine

## 2012-04-28 ENCOUNTER — Other Ambulatory Visit: Payer: Self-pay | Admitting: Internal Medicine

## 2012-08-09 ENCOUNTER — Ambulatory Visit: Payer: Managed Care, Other (non HMO) | Admitting: Internal Medicine

## 2012-10-10 ENCOUNTER — Encounter: Payer: Self-pay | Admitting: Internal Medicine

## 2012-10-10 ENCOUNTER — Telehealth: Payer: Self-pay | Admitting: *Deleted

## 2012-10-10 ENCOUNTER — Other Ambulatory Visit (INDEPENDENT_AMBULATORY_CARE_PROVIDER_SITE_OTHER): Payer: Managed Care, Other (non HMO)

## 2012-10-10 ENCOUNTER — Ambulatory Visit (INDEPENDENT_AMBULATORY_CARE_PROVIDER_SITE_OTHER): Payer: Managed Care, Other (non HMO) | Admitting: Internal Medicine

## 2012-10-10 VITALS — BP 140/92 | HR 79 | Temp 98.1°F | Wt 177.4 lb

## 2012-10-10 DIAGNOSIS — E039 Hypothyroidism, unspecified: Secondary | ICD-10-CM

## 2012-10-10 DIAGNOSIS — G2581 Restless legs syndrome: Secondary | ICD-10-CM

## 2012-10-10 DIAGNOSIS — Z1211 Encounter for screening for malignant neoplasm of colon: Secondary | ICD-10-CM

## 2012-10-10 DIAGNOSIS — E785 Hyperlipidemia, unspecified: Secondary | ICD-10-CM

## 2012-10-10 DIAGNOSIS — IMO0001 Reserved for inherently not codable concepts without codable children: Secondary | ICD-10-CM

## 2012-10-10 DIAGNOSIS — M797 Fibromyalgia: Secondary | ICD-10-CM

## 2012-10-10 DIAGNOSIS — K219 Gastro-esophageal reflux disease without esophagitis: Secondary | ICD-10-CM

## 2012-10-10 LAB — LIPID PANEL
Cholesterol: 205 mg/dL — ABNORMAL HIGH (ref 0–200)
HDL: 54.7 mg/dL (ref >39.00)
Triglycerides: 103 mg/dL (ref 0.0–149.0)
VLDL: 20.6 mg/dL (ref 0.0–40.0)

## 2012-10-10 LAB — FERRITIN: Ferritin: 60.1 ng/mL (ref 10.0–291.0)

## 2012-10-10 LAB — TSH: TSH: 2.37 u[IU]/mL (ref 0.35–5.50)

## 2012-10-10 MED ORDER — ESOMEPRAZOLE MAGNESIUM 40 MG PO PACK: 40.0000 mg | PACK | Freq: Every day | ORAL | Status: DC

## 2012-10-10 MED ORDER — ALPRAZOLAM 0.25 MG PO TABS: 0.2500 mg | ORAL_TABLET | Freq: Two times a day (BID) | ORAL | Status: DC | PRN

## 2012-10-10 MED ORDER — ESOMEPRAZOLE MAGNESIUM 40 MG PO CPDR: 40.0000 mg | DELAYED_RELEASE_CAPSULE | Freq: Every day | ORAL | Status: DC

## 2012-10-10 NOTE — Assessment & Plan Note (Signed)
The current medical regimen is effective;  continue present plan and medications.  Lab Results  Component Value Date   TSH 4.35 07/27/2011  check TSH and adjust as needed

## 2012-10-10 NOTE — Assessment & Plan Note (Signed)
Started cymbalta 12/2011 for same, dose increased to max fall 2013 by pain doc ([facility] in [place]) - doing well The current medical regimen is effective;  continue present plan and medications.

## 2012-10-10 NOTE — Patient Instructions (Signed)
It was good to see you today. We have reviewed your prior records including labs and tests today Change aciphex to Nexium -Other Medications reviewed and updated. Refill on medication(s) as discussed today. Test(s) ordered today. Your results will be released to MyChart (or called to you) after review, usually within 72hours after test completion. If any changes need to be made, you will be notified at that same time. we'll make referral to gastro for colon screening. Our office will contact you regarding appointment(s) once made. Please schedule followup in 6 months for blood pressure check, call sooner if problems.

## 2012-10-10 NOTE — Assessment & Plan Note (Signed)
Began statin spring 2012 because elevated cholesterol during cardiac clearance for back surgery Resumed same 07/2011 because of increase trend cholesterol off statin tx -  Recheck today

## 2012-10-10 NOTE — Telephone Encounter (Signed)
Left msg on vm stating md sent over rx for nexium packets. Did she meant tablet. Should be tablet resending script for tablet form...Marland KitchenMarland KitchenMarland Kitchenlmb

## 2012-10-11 NOTE — Progress Notes (Signed)
Subjective:    Patient ID: Melissa Barton, female    DOB: 11-27-1960, 52 y.o.   MRN: [id number]  HPI  Here for follow up - reviewed chronic medical issues:  GERD -occassional "esophageal and stomach burning" but stable - prior tx protonix bid, carafate qid, nexium - EGD done [facility] 01/2011 - no nausea and vomiting, no bowel changes or melena - on Aciphex since spring 2013 and doing well, but ?other formulary option  migraine headache -started Elavil for same 07/2011, stopped summer 2013 by pain mgmt History of chronic headaches, usually improved with Midrin No change in chronic headache symptoms.  MRI brain October 2010 with benign changes, soft tissue predominance near anterior inferior clivus  Dyslipidemia. Prescribed simvastatin spring 2012 during cardiac clearance for back surgery. Resumed same 07/2011. the patient reports compliance with medication(s) as prescribed. Denies adverse side effects.   Hypothyroidism. Reports variable compliance. Would like not to take these medications if not needed. Denies bowel changes, skin changes or weight changes  Depression - on celexa since 2010, changed to cymbalata for pain mgmt 12/2011 - no flares of mood  Chronic fatigue syndrome. Negative autoimmune workup 2012 by previous primary care physician and rheum eval Nickola Major). Describes overwhelming fatigue. Reports different than depression symptoms. Not improved with gabapentin - unable to tolerate due to sedation side effects   Hypertension. On ACE inhibitor. Denies adverse side effects related to medication -feels elevated blood pressure related to "whitecoat syndrome"  Chronic low back pain. Takes norco TID for same. Status post ALIF surgery by Dr. Shon Baton summer 2012. Prior lumbar laminectomy in 2010 with discectomy offered incomplete relief. Ongoing cervical evaluation with PT and pain mgmt because of LUE numbness/pain  Past Medical History  Diagnosis Date  . DDD (degenerative disc disease),  lumbar   . Depression   . GERD (gastroesophageal reflux disease)   . Hyperlipidemia   . Hypertension   . Hypothyroid   . Migraines   . Allergic rhinitis, cause unspecified   . Chronic fatigue syndrome   . Bicuspid aortic valve   . 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  Gastrointestinal: Negative for melena or bowel changes.  Neurological: Negative for dizziness or seizures.       Objective:   Physical Exam  BP 140/92  Pulse 79  Temp(Src) 98.1 F (36.7 C) (Oral)  Wt 177 lb 6.4 oz (80.468 kg)  BMI 30.44 kg/m2  SpO2 98% Wt Readings from Last 3 Encounters:  10/10/12 177 lb 6.4 oz (80.468 kg)  02/16/12 193 lb 6.4 oz (87.726 kg)  01/05/12 196 lb 12.8 oz (89.268 kg)   Constitutional: She is overweight but appears well-developed and well-nourished. No distress Cardiovascular: Normal rate, regular rhythm and normal heart sounds.  No murmur heard. No BLE edema. Pulmonary/Chest: Effort normal and breath sounds normal. No respiratory distress. She has no wheezes. Psychiatric: She has a flat but brighter than previous visits mood and affect. Her behavior is normal. Judgment, insight and thought content normal.   Lab Results  Component Value Date   WBC 8.5 02/22/2011   HGB 13.6 02/22/2011   HCT 38.2 02/22/2011   PLT 280 02/22/2011   GLUCOSE 87 02/22/2011   CHOL 205* 10/10/2012   TRIG 103.0 10/10/2012   HDL 54.70 10/10/2012  LDLDIRECT 128.4 10/10/2012   NA 137 02/22/2011   K 4.1 02/22/2011   CL 99 02/22/2011   CREATININE 0.63 02/22/2011   BUN 13 02/22/2011   CO2 28 02/22/2011   TSH 2.37 10/10/2012       Assessment & Plan:  See problem list. Medications and labs reviewed today.

## 2012-10-11 NOTE — Assessment & Plan Note (Signed)
Incomplete relief, chronic symptoms   prior EGD 01/2011 at [facility] report unremarkable (per pt) Prev nexium rx effective but costly per pt - changed PPI pantopro, then to Aciphex 08/2011 Change PPI again now to formulary per request continue carafate  recommended follow up GI if persisting or worsening symptoms

## 2012-10-23 ENCOUNTER — Encounter: Payer: Self-pay | Admitting: Internal Medicine

## 2012-10-24 ENCOUNTER — Telehealth: Payer: Self-pay | Admitting: Internal Medicine

## 2012-10-24 MED ORDER — AMOXICILLIN-POT CLAVULANATE 875-125 MG PO TABS: 1.0000 | ORAL_TABLET | Freq: Two times a day (BID) | ORAL | Status: AC

## 2012-10-24 NOTE — Telephone Encounter (Signed)
Patient calling, has not heard from the office.  As noted below, medication has been called to the pharmacy and I advised patient of this.  She voiced an understanding that if her sx worsen or do not improve she is to call for an OV.

## 2012-10-24 NOTE — Telephone Encounter (Signed)
[facility] Triage Call Report Triage Record Num: [id number] Operator: Cornell Barman Patient Name: Melissa Barton Call Date & Time: 10/23/2012 4:53:04PM Patient Phone: [phone] PCP: Rene Paci Patient Gender: Female PCP Fax : [phone] Patient DOB: 1960/11/02 Practice Name: [facility] Reason for Call: Caller: Snow/Patient; PCP: Rene Paci (Adults only); CB#: [phone]; Call regarding Green Nasal Drainage and Coughing Up Green Mucus; She states that her daughter is sick and had the flu recently. She states she became ill last week 10/16/12. She reports generalized cold symptoms and now chest cold and sinus infection. Afebrile. +cough productive thick and green. Sinus headache and pressure and drainage green. Treatment at home with Tylenol, nettie pot and Nyquil . LMP-hysterectomy. Patient requesting antibotics since in office on 10/10/12. Emergent s/sx ruled out per URI Infection protocol with the exception to "Pressure pain about and below eyes, near ears over sinus/green drainage. See provdier in 24 hours. Patient is aware of office policy regarding antibiotics. Was just seen in the office on 10/10/12 and would like request sent to physician. Home care instructions reviewed along with call back parameters. Pharmacy [facility] in [place] [phone]. Protocol(s) Used: Upper Respiratory Infection (URI) Recommended Outcome per Protocol: See Provider within 24 hours Reason for Outcome: Pressure/pain above or below eyes, near ears over sinuses (may also be described as fullness, worsens when bending forward, teeth or eye pain) AND yellow-green drainage from nose or down back of throat. Care Advice: ~ Use a cool mist humidifier to moisten air. Be sure to clean according to manufacturer's instructions. ~ See provider in 8 hours if redness, swelling and tenderness develops above or below eyes/near ears/over sinuses ~ Consider use of a saline nasal spray per package directions to  help relieve nasal congestion. Drink more fluids -- water, low-sugar juices, tea and warm soup, especially chicken broth, are options. Avoid caffeinated or alcoholic beverages because they can increase the chance of dehydration. ~ ~ SYMPTOM / CONDITION MANAGEMENT A warm, moist compress placed on face, over eyes for 15 to 20 minutes, 5 to 6 times a day, may help relieve the congestion. ~ If pregnancy is known or suspected, get advice from provider before using any nonprescription medication other than acetaminophen ~ Nasal Irrigation To Relieve Congestion: - Wash hands with soap and water. - Add 1/2 teaspoon salt to 1 cup warm water to make saltwater solution. - Use a bulb syringe to draw up the saltwater solution. Turn the bulb upright to squeeze out any air. Fill the syringe until is full. - Bend over sink bowl and lean slightly toward sink. Gently insert the tip of the syringe into nostril about 1/2 inch. Point tip toward outer corner of eye and GENTLY squeeze bulb to squirt saltwater into nostril. Forceful irrigation to clear sinuses requires the use of sterile water or saline. - Let solution drain from nose. Solution may come out of the other nostril or even your mouth. Do two full syringes of solution in each nostril. - Rinse syringe in clean water several times. Be sure water comes out clear. Dry the bulb syringe and store in a Total water intake includes drinking water, water in beverages, and water contained in food. Fluids make up about 80% of the body's total hydration need. Individual fluid requirement to maintain hydration vary based on physical activity, environmental factors and illness. Limit fluids that contain sugar, caffeine, or alcohol. Urine will be very light yellow color when you drink enough fluids.

## 2012-10-24 NOTE — Telephone Encounter (Signed)
augmentin erx done - bid x 10d OV if worse or unimproved as symptoms are NEW since her last OV here

## 2012-10-26 ENCOUNTER — Ambulatory Visit: Payer: Managed Care, Other (non HMO) | Admitting: Internal Medicine

## 2012-10-30 ENCOUNTER — Encounter (HOSPITAL_COMMUNITY): Payer: Self-pay | Admitting: Adult Health

## 2012-10-30 ENCOUNTER — Emergency Department (HOSPITAL_COMMUNITY): Payer: Managed Care, Other (non HMO)

## 2012-10-30 DIAGNOSIS — G894 Chronic pain syndrome: Secondary | ICD-10-CM | POA: Insufficient documentation

## 2012-10-30 DIAGNOSIS — Y929 Unspecified place or not applicable: Secondary | ICD-10-CM | POA: Insufficient documentation

## 2012-10-30 DIAGNOSIS — Q231 Congenital insufficiency of aortic valve: Secondary | ICD-10-CM | POA: Insufficient documentation

## 2012-10-30 DIAGNOSIS — K219 Gastro-esophageal reflux disease without esophagitis: Secondary | ICD-10-CM | POA: Insufficient documentation

## 2012-10-30 DIAGNOSIS — Z79899 Other long term (current) drug therapy: Secondary | ICD-10-CM | POA: Insufficient documentation

## 2012-10-30 DIAGNOSIS — W64XXXA Exposure to other animate mechanical forces, initial encounter: Secondary | ICD-10-CM | POA: Insufficient documentation

## 2012-10-30 DIAGNOSIS — Z8679 Personal history of other diseases of the circulatory system: Secondary | ICD-10-CM | POA: Insufficient documentation

## 2012-10-30 DIAGNOSIS — F3289 Other specified depressive episodes: Secondary | ICD-10-CM | POA: Insufficient documentation

## 2012-10-30 DIAGNOSIS — I10 Essential (primary) hypertension: Secondary | ICD-10-CM | POA: Insufficient documentation

## 2012-10-30 DIAGNOSIS — E039 Hypothyroidism, unspecified: Secondary | ICD-10-CM | POA: Insufficient documentation

## 2012-10-30 DIAGNOSIS — M5137 Other intervertebral disc degeneration, lumbosacral region: Secondary | ICD-10-CM | POA: Insufficient documentation

## 2012-10-30 DIAGNOSIS — R5382 Chronic fatigue, unspecified: Secondary | ICD-10-CM | POA: Insufficient documentation

## 2012-10-30 DIAGNOSIS — Y939 Activity, unspecified: Secondary | ICD-10-CM | POA: Insufficient documentation

## 2012-10-30 DIAGNOSIS — M51379 Other intervertebral disc degeneration, lumbosacral region without mention of lumbar back pain or lower extremity pain: Secondary | ICD-10-CM | POA: Insufficient documentation

## 2012-10-30 DIAGNOSIS — E785 Hyperlipidemia, unspecified: Secondary | ICD-10-CM | POA: Insufficient documentation

## 2012-10-30 DIAGNOSIS — S0120XA Unspecified open wound of nose, initial encounter: Secondary | ICD-10-CM | POA: Insufficient documentation

## 2012-10-30 DIAGNOSIS — G9332 Myalgic encephalomyelitis/chronic fatigue syndrome: Secondary | ICD-10-CM | POA: Insufficient documentation

## 2012-10-30 NOTE — ED Notes (Signed)
Presents with post nasal surgery one month ago, tonight husky jumped up and hit pt in nose, nose began to bleed and pt notified Dr. Jenne Pane ENT, told to come here to see Dr. Pollyann Kennedy.  Bleeding is controlled.  Takes lortab, however states, "this was pretty painful even with medication" nose is swollen and red.

## 2012-10-31 ENCOUNTER — Emergency Department (HOSPITAL_COMMUNITY)
Admission: EM | Admit: 2012-10-31 | Discharge: 2012-10-31 | Disposition: A | Discharge status: Home / Self Care | Payer: Managed Care, Other (non HMO) | Attending: Otolaryngology | Admitting: Otolaryngology

## 2012-10-31 NOTE — Consult Note (Signed)
Septoplasty one month ago with Dr. Jenne Pane. A few hours ago, she accidentally got hit in the nose by her dog. She believes it was the choke collar that actually hit her. She had a laceration of the tip with significant bleeding which has since stopped. No other pertinent history.  PE: 2 cm curved laceration along the nasal tip area. The edges are holding together. I do not see any mucosal injury inside the nose and the nasal bones and dorsum are straight and symmetric. The edges of the laceration do not pull apart with cleaning of the skin. I simply applied Dermabond to hold the edges together. There was no further bleeding.  She is currently on Augmentin. Recommend she continue that and keep the area clean and dry. She may start ot use soap and water gently tomorrow but should avoid oils or ointments. Follow up with Dr. Jenne Pane or myself in the next few days or sooner if she has any problems.

## 2012-10-31 NOTE — ED Notes (Signed)
Dr. Pollyann Kennedy in to see pt and derma bond nose.

## 2012-11-09 ENCOUNTER — Encounter: Payer: Self-pay | Admitting: Internal Medicine

## 2012-11-15 ENCOUNTER — Ambulatory Visit (INDEPENDENT_AMBULATORY_CARE_PROVIDER_SITE_OTHER): Payer: Managed Care, Other (non HMO) | Admitting: Internal Medicine

## 2012-11-15 ENCOUNTER — Encounter: Payer: Self-pay | Admitting: Internal Medicine

## 2012-11-15 VITALS — BP 138/82 | HR 68 | Ht 64.0 in | Wt 181.8 lb

## 2012-11-15 DIAGNOSIS — Z1211 Encounter for screening for malignant neoplasm of colon: Secondary | ICD-10-CM

## 2012-11-15 DIAGNOSIS — R1310 Dysphagia, unspecified: Secondary | ICD-10-CM

## 2012-11-15 DIAGNOSIS — R110 Nausea: Secondary | ICD-10-CM

## 2012-11-15 MED ORDER — PEG-KCL-NACL-NASULF-NA ASC-C 100 G PO SOLR: 1.0000 | Freq: Once | ORAL | Status: DC

## 2012-11-15 MED ORDER — ALIGN PO CAPS: 1.0000 | ORAL_CAPSULE | Freq: Every day | ORAL | Status: DC

## 2012-11-15 MED ORDER — PANTOPRAZOLE SODIUM 40 MG PO TBEC: 40.0000 mg | DELAYED_RELEASE_TABLET | Freq: Every day | ORAL | Status: DC

## 2012-11-15 NOTE — Progress Notes (Signed)
Patient ID: Melissa Barton, female   DOB: 19-Mar-1961, 52 y.o.   MRN: [id number] HPI: Melissa Barton is a 52 yo female with past medical history of chronic pain due to degenerative disc disease and back pain, GERD, hypothyroidism, hypertension, hyperlipidemia who seen in consultation at the request of Dr. Felicity Barton for evaluation of dysphagia, GERD, and nausea.  She reports a long-standing history of heartburn and previous workup of dysphagia by Dr. Karilyn Barton in [place].  She had an upper endoscopy in June of 2012. She also has continued to have heartburn requiring PPI therapy long-term. She's tried Protonix, AcipHex, and most recently Nexium. It seems that her PPI has switched as her insurance has dictated what they will pay for.  She is currently on Nexium 40 mg daily which helps significantly with her daily heartburn. She still has regurgitation with bending over as well as some solid food dysphagia. Pills actually are the hardest thing for her to swallow. She also reports tightness in her chest with swallowing cold liquids which she relates to esophageal spasm. She reports intermittent but frequent nausea but no vomiting. She also endorses some mild early satiety and decreased appetite. She reports a 30 pound weight loss over the last approximate year. Her bowel habits have very between constipation and loose over the years. She occasionally has hemorrhoids with bright red rectal bleeding. She denies melena. Occasionally she has lower abdominal cramping associated with stools. He does occasionally use hydrocodone for her chronic pain in this vessel he worsens or constipation. She is using Zofran occasionally which helps with her nausea. She's also taking Carafate on an as-needed basis for epigastric burning pain. No fevers or chills.  Past Medical History  Diagnosis Date  . DDD (degenerative disc disease), lumbar   . Depression   . GERD (gastroesophageal reflux disease)   . Hyperlipidemia   . Hypertension   .  Hypothyroid   . Migraines   . Allergic rhinitis, cause unspecified   . Chronic fatigue syndrome   . Bicuspid aortic valve   . Chronic pain syndrome     headaches, neck and back    Past Surgical History  Procedure Laterality Date  . Appendectomy  1980  . Abdominal hysterectomy  1990  . Lumbar laminectomy  2010    partial diskectomy  . Surgical repair of multiple fractuers  2011  . Alif  2012  . Nasal fracture surgery    . Nasal septum surgery      Current Outpatient Prescriptions  Medication Sig Dispense Refill  . ALPRAZolam (XANAX) 0.25 MG tablet Take 1 tablet (0.25 mg total) by mouth 2 (two) times daily as needed for anxiety.  60 tablet  3  . Alum Hydroxide-Mag Carbonate (GAVISCON PO) Take by mouth as needed.        . DULoxetine (CYMBALTA) 60 MG capsule Take 60 mg by mouth daily.      Marland Kitchen esomeprazole (NEXIUM) 40 MG capsule Take 1 capsule (40 mg total) by mouth daily before breakfast.  30 capsule  11  . HYDROcodone-acetaminophen (NORCO) 5-325 MG per tablet Take by mouth as directed. Take 1 every 4 hours and 2 at bedtime      . levothyroxine (SYNTHROID, LEVOTHROID) 75 MCG tablet Take 75 mcg by mouth daily.        Marland Kitchen lisinopril (PRINIVIL,ZESTRIL) 20 MG tablet Take 20 mg by mouth daily.      . Multiple Vitamin (MULTIVITAMIN) tablet Take 1 tablet by mouth daily.        Marland Kitchen  ondansetron (ZOFRAN) 4 MG tablet Take 4 mg by mouth every 4 (four) hours as needed.        . simvastatin (ZOCOR) 40 MG tablet Take 0.5 tablets (20 mg total) by mouth at bedtime.  30 tablet  6  . sucralfate (CARAFATE) 1 G tablet Take 1 g by mouth 4 (four) times daily.        . bifidobacterium infantis (ALIGN) capsule Take 1 capsule by mouth daily.  30 capsule  0  . pantoprazole (PROTONIX) 40 MG tablet Take 1 tablet (40 mg total) by mouth daily.  30 tablet  1  . peg 3350 powder (MOVIPREP) 100 G SOLR Take 1 kit (100 g total) by mouth once.  1 kit  0   No current facility-administered medications for this visit.    No  Known Allergies  Family History  Problem Relation Age of Onset  . Arthritis Mother   . Pancreatic cancer Mother   . Arthritis Father   . Clotting disorder Father   . Alcohol abuse Other   . Arthritis Other   . Hyperlipidemia Other   . Hypertension Other   . Diabetes Other     History   Social History  . Marital Status: Married    Spouse Name: N/A    Number of Children: 2  . Years of Education: N/A   Occupational History  . Disabled     RN   Social History Main Topics  . Smoking status: Current Every Day Smoker    Types: Cigarettes  . Smokeless tobacco: Never Used  . Alcohol Use: No  . Drug Use: No  . Sexually Active: None   Other Topics Concern  . None   Social History Narrative  . None    ROS: As per history of present illness, otherwise negative  BP 138/82  Pulse 68  Ht 5\' 4"  (1.626 m)  Wt 181 lb 12.8 oz (82.464 kg)  BMI 31.19 kg/m2 Constitutional: Well-developed and well-nourished. No distress. HEENT: Normocephalic and atraumatic. Oropharynx is clear and moist. No oropharyngeal exudate. Conjunctivae are normal.  No scleral icterus. Neck: Neck supple. Trachea midline. Cardiovascular: Normal rate, regular rhythm and intact distal pulses. No M/R/G Pulmonary/chest: Effort normal and breath sounds normal. No wheezing, rales or rhonchi. Abdominal: Soft, nontender, nondistended. Bowel sounds active throughout. There are no masses palpable. No hepatosplenomegaly. Extremities: no clubbing, cyanosis, or edema Lymphadenopathy: No cervical adenopathy noted. Neurological: Alert and oriented to person place and time. Skin: Skin is warm and dry. No rashes noted. Psychiatric: Normal mood and affect. Behavior is normal.  RELEVANT LABS AND IMAGING: CBC    Component Value Date/Time   WBC 8.5 02/22/2011 1325   RBC 4.33 02/22/2011 1325   HGB 13.6 02/22/2011 1325   HCT 38.2 02/22/2011 1325   PLT 280 02/22/2011 1325   MCV 88.2 02/22/2011 1325   MCH 31.4 02/22/2011 1325   MCHC  35.6 02/22/2011 1325   RDW 12.7 02/22/2011 1325    CMP     Component Value Date/Time   NA 137 02/22/2011 1325   K 4.1 02/22/2011 1325   CL 99 02/22/2011 1325   CO2 28 02/22/2011 1325   GLUCOSE 87 02/22/2011 1325   BUN 13 02/22/2011 1325   CREATININE 0.63 02/22/2011 1325   CALCIUM 9.5 02/22/2011 1325   GFRNONAA >60 02/22/2011 1325   GFRAA >60 02/22/2011 1325   EGD - June 2012 - report not available, pathology from this procedure reveals esophageal biopsies with no evidence of  active inflammation or fungal organism. No evidence of eosinophilic esophagitis. No evidence of intestinal metaplasia dysplasia or malignancy. (Patient reports dilation was done with this procedure)  ASSESSMENT/PLAN: 52 yo female with past medical history of chronic pain due to degenerative disc disease and back pain, GERD, hypothyroidism, hypertension, hyperlipidemia who seen in consultation at the request of Dr. Felicity Barton for evaluation of dysphagia, GERD, and nausea  1.  GERD/dysphagia -- the patient has a long-standing history of GERD but had upper endoscopy within the last 2 years. We will request this report for review. Her heartburn is better with Nexium, but for insurance purposes we will transition her to pantoprazole 40 mg daily. If if this does not adequately control her symptoms, we can switch back to Nexium and proceed with any necessary prior authorization.  I think esophageal manometry as a next reasonable test given she did not respond to dilation and there is no evidence of eosinophilic esophagitis. At this point I will not schedule upper endoscopy, but we will consider repeating this based on manometry results.  2.  Nausea -- unclear what the driving force is behind her nausea. Again she's had an upper endoscopy which revealed nothing structural. I will check H. pylori IgG today. She has Zofran to be used as needed for this.  3.  CRC screening -- she is 52 years old and I recommended colorectal cancer screening with  colonoscopy. This test will be scheduled. Will try her on a probiotic to help regulate her bowel habits and this may in fact help with #1 and 2 above.

## 2012-11-15 NOTE — Patient Instructions (Addendum)
You have been given a separate informational sheet regarding your tobacco use, the importance of quitting and local resources to help you quit.  You have been scheduled for an Esophageal Manometry on 11/20/2012 @ 8:00am arrive at 7:30 report to outpatient  You are to have nothing to eat or drink after midnight prior to your appointment.   You have been scheduled for a colonoscopy/Endoscopy with propofol. Please follow written instructions given to you at your visit today.  Please pick up your prep kit at the pharmacy within the next 1-3 days. If you use inhalers (even only as needed), please bring them with you on the day of your procedure.  We have given you samples of Align. This puts good bacteria back into your intestines. You should take 1 capsule by mouth once daily. If this works well for you, it can be purchased over the counter.  Stop taking Nexium We have sent a prescription of Protonix to your pharamcy. Please take as directed.

## 2012-11-20 ENCOUNTER — Ambulatory Visit (HOSPITAL_COMMUNITY)
Admission: RE | Admit: 2012-11-20 | Discharge: 2012-11-20 | Disposition: A | Discharge status: Home / Self Care | Payer: Managed Care, Other (non HMO) | Source: Ambulatory Visit | Attending: Internal Medicine | Admitting: Internal Medicine

## 2012-11-20 ENCOUNTER — Encounter (HOSPITAL_COMMUNITY)
Admission: RE | Disposition: A | Discharge status: Home / Self Care | Payer: Self-pay | Source: Ambulatory Visit | Attending: Internal Medicine

## 2012-11-20 ENCOUNTER — Encounter (HOSPITAL_COMMUNITY): Payer: Self-pay

## 2012-11-20 DIAGNOSIS — K224 Dyskinesia of esophagus: Secondary | ICD-10-CM | POA: Insufficient documentation

## 2012-11-20 DIAGNOSIS — K219 Gastro-esophageal reflux disease without esophagitis: Secondary | ICD-10-CM | POA: Insufficient documentation

## 2012-11-20 DIAGNOSIS — R1310 Dysphagia, unspecified: Secondary | ICD-10-CM | POA: Insufficient documentation

## 2012-11-20 HISTORY — PX: ESOPHAGEAL MANOMETRY: SHX5429

## 2012-11-20 SURGERY — MANOMETRY, ESOPHAGUS
Anesthesia: Choice

## 2012-11-20 MED ORDER — LIDOCAINE VISCOUS 2 % MT SOLN
OROMUCOSAL | Status: AC
  Filled 2012-11-20: qty 15

## 2012-11-21 ENCOUNTER — Encounter (HOSPITAL_COMMUNITY): Payer: Self-pay | Admitting: Internal Medicine

## 2012-11-24 ENCOUNTER — Telehealth: Payer: Self-pay | Admitting: Internal Medicine

## 2012-11-24 NOTE — Telephone Encounter (Signed)
Manometry reveals hypertensive contractions in her esophagus, contractions that are more forceful than normal which can cause dysphagia symptoms  She has had upper endoscopy with biopsies and dilation in the past without much benefit  Please contact her with these results I recommend a trial of diltiazem 60 mg 4 times daily.  This is a calcium channel blocker which helps to relax esophageal smooth muscle, and hopefully will help with her dysphagia symptoms.  She should be made aware this medication can also be used as a blood pressure medicine. She should monitor for dizziness or lightheadedness, fatigue. If this occurs she should let us know If it benefits her we could pursue the longer acting diltiazem which will be taken once a day  She has a colonoscopy scheduled in early May Have her come in to see me in late April to discuss the benefits of this new medication

## 2012-11-28 MED ORDER — DILTIAZEM HCL 60 MG PO TABS: 60.0000 mg | ORAL_TABLET | Freq: Four times a day (QID) | ORAL | Status: DC

## 2012-11-28 NOTE — Telephone Encounter (Signed)
Patient aware.  REV scheduled for 12/18/12 10:00.  She is advised of the potential side effects.  She states she will hold her lisinopril when she takes the diltiazem the first time.  She will call back for any questions or problems

## 2012-11-28 NOTE — Addendum Note (Signed)
Addended by: Annett Fabian on: 11/28/2012 04:13 PM   Modules accepted: Orders

## 2012-12-14 ENCOUNTER — Encounter: Payer: Self-pay | Admitting: Internal Medicine

## 2012-12-18 ENCOUNTER — Ambulatory Visit: Payer: Managed Care, Other (non HMO) | Admitting: Internal Medicine

## 2012-12-18 ENCOUNTER — Encounter: Payer: Self-pay | Admitting: Internal Medicine

## 2012-12-18 NOTE — Telephone Encounter (Signed)
Error

## 2012-12-28 ENCOUNTER — Encounter: Payer: Self-pay | Admitting: Internal Medicine

## 2012-12-28 ENCOUNTER — Ambulatory Visit (AMBULATORY_SURGERY_CENTER): Payer: Managed Care, Other (non HMO) | Admitting: Internal Medicine

## 2012-12-28 VITALS — BP 140/78 | HR 75 | Temp 96.8°F | Resp 17 | Ht 64.0 in | Wt 181.0 lb

## 2012-12-28 DIAGNOSIS — K208 Other esophagitis: Secondary | ICD-10-CM

## 2012-12-28 DIAGNOSIS — R1310 Dysphagia, unspecified: Secondary | ICD-10-CM

## 2012-12-28 DIAGNOSIS — K635 Polyp of colon: Secondary | ICD-10-CM

## 2012-12-28 DIAGNOSIS — D126 Benign neoplasm of colon, unspecified: Secondary | ICD-10-CM

## 2012-12-28 DIAGNOSIS — K219 Gastro-esophageal reflux disease without esophagitis: Secondary | ICD-10-CM

## 2012-12-28 DIAGNOSIS — Z1211 Encounter for screening for malignant neoplasm of colon: Secondary | ICD-10-CM

## 2012-12-28 MED ORDER — DILTIAZEM HCL ER COATED BEADS 240 MG PO CP24: 240.0000 mg | ORAL_CAPSULE | Freq: Every day | ORAL | Status: DC

## 2012-12-28 MED ORDER — SODIUM CHLORIDE 0.9 % IV SOLN: 500.0000 mL | INTRAVENOUS | Status: DC

## 2012-12-28 NOTE — Op Note (Signed)
[facility] [address] [place], [address]   COLONOSCOPY PROCEDURE REPORT  PATIENT: Melissa Barton, Melissa Barton  MR#: [id number] BIRTHDATE: 02-19-1961 , 51  yrs. old GENDER: Female ENDOSCOPIST: Beverley Fiedler, MD PROCEDURE DATE:  12/28/2012 PROCEDURE:   Colonoscopy with cold biopsy polypectomy and Colonoscopy with snare polypectomy ASA CLASS:   Class II INDICATIONS:average risk screening and first colonoscopy. MEDICATIONS: MAC sedation, administered by CRNA and propofol (Diprivan) 150mg  IV  DESCRIPTION OF PROCEDURE:   After the risks benefits and alternatives of the procedure were thoroughly explained, informed consent was obtained.  A digital rectal exam revealed no rectal mass.   The LB [id number]-[id number]  endoscope was introduced through the anus and advanced to the cecum, which was identified by both the appendix and ileocecal valve. No adverse events experienced. The quality of the prep was Moviprep fair  The instrument was then slowly withdrawn as the colon was fully examined.   COLON FINDINGS: A sessile polyp measuring 5 mm in size was found in the transverse colon.  A polypectomy was performed with a cold snare.  The resection was complete and the polyp tissue was completely retrieved.   Five sessile polyps ranging between 3-50mm in size were found in the distal sigmoid colon and rectum. Polypectomy was performed with cold forceps (4) and using cold snare (1).  All resections were complete and all polyp tissue was completely retrieved.   There was mild scattered diverticulosis noted in the descending colon and sigmoid colon.  Retroflexed views revealed no abnormalities. The time to cecum=6 minutes 21 seconds. Withdrawal time=14 minutes 28 seconds.  The scope was withdrawn and the procedure completed. COMPLICATIONS: There were no complications.  ENDOSCOPIC IMPRESSION: 1.   Sessile polyp measuring 5 mm in size was found in the transverse colon; polypectomy was  performed with a cold snare 2.   Five sessile polyps ranging between 3-59mm in size were found in the distal sigmoid colon and rectum; Polypectomy was performed with cold forceps and using cold snare 3.   There was mild diverticulosis noted in the descending colon and sigmoid colon  RECOMMENDATIONS: 1.  Await pathology results 2.  High fiber diet 3.  Timing of repeat colonoscopy will be determined by pathology findings. 4.  You will receive a letter within 1-2 weeks with the results of your biopsy as well as final recommendations.  Please call my office if you have not received a letter after 3 weeks.   eSigned:  Beverley Fiedler, MD 12/28/2012 12:21 PM  cc: The Patient and Newt Lukes, MD

## 2012-12-28 NOTE — Op Note (Signed)
[facility] [address] [place], [address]   ENDOSCOPY PROCEDURE REPORT  PATIENT: Melissa Barton, Melissa Barton  MR#: [id number] BIRTHDATE: 03-22-1961 , 51  yrs. old GENDER: Female ENDOSCOPIST: Beverley Fiedler, MD REFERRED BY:  Rene Paci, M.D. PROCEDURE DATE:  12/28/2012 PROCEDURE:  EGD w/ biopsy ASA CLASS:     Class II INDICATIONS:  Dysphagia.   history of GERD. MEDICATIONS: MAC sedation, administered by CRNA and propofol (Diprivan) 200mg  IV TOPICAL ANESTHETIC: Cetacaine Spray  DESCRIPTION OF PROCEDURE: After the risks benefits and alternatives of the procedure were thoroughly explained, informed consent was obtained.  The LB GIF-H180 [id number] endoscope was introduced through the mouth and advanced to the second portion of the duodenum. Without limitations.  The instrument was slowly withdrawn as the mucosa was fully examined.   ESOPHAGUS: The mucosa of the esophagus appeared normal.  There were several, small, whitish plaques which were also biopsied to exclude candida. Multiple biopsies were taken in the distal and mid esophagus to rule out eosinophilic esophagitis.   A 3 cm hiatal hernia was noted.   A normal Z-line was observed 36 cm from the incisors.  STOMACH: The mucosa of the stomach appeared normal.  DUODENUM: The duodenal mucosa showed no abnormalities in the bulb and second portion of the duodenum.  Retroflexed views revealed a hiatal hernia.     The scope was then withdrawn from the patient and the procedure completed.  COMPLICATIONS: There were no complications. ENDOSCOPIC IMPRESSION: 1.   The mucosa of the esophagus appeared normal; multiple biopsies were taken in the distal and mid esophagus to rule out eosinophilic esophagitis 2.   3 cm hiatal hernia 3.   Normal Z-line was observed 36 cm from the incisors 4.   The mucosa of the stomach appeared normal 5.   The duodenal mucosa showed no abnormalities in the bulb and second portion of the  duodenum  RECOMMENDATIONS: 1.  Await pathology results 2.  Continue current medications 3.  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

## 2012-12-28 NOTE — Progress Notes (Signed)
Report to pacu rn, vss, bbs=clear 

## 2012-12-28 NOTE — Progress Notes (Signed)
Patient did not experience any of the following events: a burn prior to discharge; a fall within the facility; wrong site/side/patient/procedure/implant event; or a hospital transfer or hospital admission upon discharge from the facility. (G8907) Patient did not have preoperative order for IV antibiotic SSI prophylaxis. (G8918)  

## 2012-12-28 NOTE — Patient Instructions (Addendum)
YOU HAD AN ENDOSCOPIC PROCEDURE TODAY AT THE [facility]: Refer to the procedure report that was given to you for any specific questions about what was found during the examination.  If the procedure report does not answer your questions, please call your gastroenterologist to clarify.  If you requested that your care partner not be given the details of your procedure findings, then the procedure report has been included in a sealed envelope for you to review at your convenience later.  YOU SHOULD EXPECT: Some feelings of bloating in the abdomen. Passage of more gas than usual.  Walking can help get rid of the air that was put into your GI tract during the procedure and reduce the bloating. If you had a lower endoscopy (such as a colonoscopy or flexible sigmoidoscopy) you may notice spotting of blood in your stool or on the toilet paper. If you underwent a bowel prep for your procedure, then you may not have a normal bowel movement for a few days.  DIET: Your first meal following the procedure should be a light meal and then it is ok to progress to your normal diet.  A half-sandwich or bowl of soup is an example of a good first meal.  Heavy or fried foods are harder to digest and may make you feel nauseous or bloated.  Likewise meals heavy in dairy and vegetables can cause extra gas to form and this can also increase the bloating.  Drink plenty of fluids but you should avoid alcoholic beverages for 24 hours.  ACTIVITY: Your care partner should take you home directly after the procedure.  You should plan to take it easy, moving slowly for the rest of the day.  You can resume normal activity the day after the procedure however you should NOT DRIVE or use heavy machinery for 24 hours (because of the sedation medicines used during the test).    SYMPTOMS TO REPORT IMMEDIATELY: A gastroenterologist can be reached at any hour.  During normal business hours, 8:30 AM to 5:00 PM Monday through Friday,  call [phone].  After hours and on weekends, please call the GI answering service at [phone] who will take a message and have the physician on call contact you.   Following lower endoscopy (colonoscopy or flexible sigmoidoscopy):  Excessive amounts of blood in the stool  Significant tenderness or worsening of abdominal pains  Swelling of the abdomen that is new, acute  Fever of 100F or higher  Following upper endoscopy (EGD)  Vomiting of blood or coffee ground material  New chest pain or pain under the shoulder blades  Painful or persistently difficult swallowing  New shortness of breath  Fever of 100F or higher  Black, tarry-looking stools  FOLLOW UP: If any biopsies were taken you will be contacted by phone or by letter within the next 1-3 weeks.  Call your gastroenterologist if you have not heard about the biopsies in 3 weeks.  Our staff will call the home number listed on your records the next business day following your procedure to check on you and address any questions or concerns that you may have at that time regarding the information given to you following your procedure. This is a courtesy call and so if there is no answer at the home number and we have not heard from you through the emergency physician on call, we will assume that you have returned to your regular daily activities without incident.  SIGNATURES/CONFIDENTIALITY: You and/or your care   partner have signed paperwork which will be entered into your electronic medical record.  These signatures attest to the fact that that the information above on your After Visit Summary has been reviewed and is understood.  Full responsibility of the confidentiality of this discharge information lies with you and/or your care-partner.  

## 2012-12-29 ENCOUNTER — Telehealth: Payer: Self-pay | Admitting: *Deleted

## 2012-12-29 NOTE — Telephone Encounter (Signed)
  Follow up Call-  Call back number 12/28/2012  Post procedure Call Back phone  # [phone], [phone]  Permission to leave phone message Yes     Patient questions:  Do you have a fever, pain , or abdominal swelling? no Pain Score  0 *  Have you tolerated food without any problems? yes  Have you been able to return to your normal activities? yes  Do you have any questions about your discharge instructions: Diet   no Medications  no Follow up visit  no  Do you have questions or concerns about your Care? no  Actions: * If pain score is 4 or above: No action needed, pain <4.

## 2013-01-03 ENCOUNTER — Encounter: Payer: Self-pay | Admitting: Internal Medicine

## 2013-05-22 ENCOUNTER — Other Ambulatory Visit: Payer: Self-pay | Admitting: Internal Medicine

## 2013-07-10 ENCOUNTER — Other Ambulatory Visit: Payer: Self-pay | Admitting: Gastroenterology

## 2013-07-10 ENCOUNTER — Telehealth: Payer: Self-pay | Admitting: Gastroenterology

## 2013-07-10 NOTE — Telephone Encounter (Signed)
Pharmacy faxed a Rx request for Cartia, called pt to see if she has requested it. Pt said pharmacy has it on auto refill, she has over a bottle and a half at home and does not take the medication everyday. I asked her if she experiences any sever muscle pain when taking it because it interacts with simvastatin and can myopaty. Pt said she has fibromyalgia and is always in pain, but does not have more pain when taking it. I told her I will not refill this request since she has so much at home but to call us if she does need a refill and or she experiences sever pain when taking it. Pt verbalized understanding.

## 2013-07-12 ENCOUNTER — Other Ambulatory Visit: Payer: Self-pay | Admitting: Gastroenterology

## 2013-10-08 LAB — HM MAMMOGRAPHY

## 2013-10-17 LAB — HM MAMMOGRAPHY: HM MAMMO: NEGATIVE

## 2013-11-16 ENCOUNTER — Ambulatory Visit (INDEPENDENT_AMBULATORY_CARE_PROVIDER_SITE_OTHER): Payer: Managed Care, Other (non HMO) | Admitting: Internal Medicine

## 2013-11-16 ENCOUNTER — Encounter: Payer: Self-pay | Admitting: Internal Medicine

## 2013-11-16 VITALS — BP 128/80 | HR 70 | Temp 98.3°F | Wt 197.6 lb

## 2013-11-16 DIAGNOSIS — R5382 Chronic fatigue, unspecified: Secondary | ICD-10-CM

## 2013-11-16 DIAGNOSIS — F988 Other specified behavioral and emotional disorders with onset usually occurring in childhood and adolescence: Secondary | ICD-10-CM | POA: Insufficient documentation

## 2013-11-16 DIAGNOSIS — E039 Hypothyroidism, unspecified: Secondary | ICD-10-CM

## 2013-11-16 DIAGNOSIS — E785 Hyperlipidemia, unspecified: Secondary | ICD-10-CM

## 2013-11-16 DIAGNOSIS — G9332 Myalgic encephalomyelitis/chronic fatigue syndrome: Secondary | ICD-10-CM

## 2013-11-16 MED ORDER — ESOMEPRAZOLE MAGNESIUM 40 MG PO CPDR: 40.0000 mg | DELAYED_RELEASE_CAPSULE | Freq: Every day | ORAL | Status: DC

## 2013-11-16 MED ORDER — LEVOTHYROXINE SODIUM 75 MCG PO TABS: 75.0000 ug | ORAL_TABLET | Freq: Every day | ORAL | Status: DC

## 2013-11-16 MED ORDER — LISDEXAMFETAMINE DIMESYLATE 30 MG PO CAPS: 30.0000 mg | ORAL_CAPSULE | Freq: Every day | ORAL | Status: DC

## 2013-11-16 MED ORDER — LISINOPRIL 20 MG PO TABS: 20.0000 mg | ORAL_TABLET | Freq: Every day | ORAL | Status: DC

## 2013-11-16 MED ORDER — ALPRAZOLAM 0.25 MG PO TABS: 0.2500 mg | ORAL_TABLET | Freq: Two times a day (BID) | ORAL | Status: AC | PRN

## 2013-11-16 NOTE — Assessment & Plan Note (Signed)
Began statin spring 2012 because elevated cholesterol during cardiac clearance for back surgery Requested to resume siva 07/2011 but pt declined to do so Check lipids, re-rx as needed

## 2013-11-16 NOTE — Progress Notes (Signed)
Pre visit review using our clinic review tool, if applicable. No additional management support is needed unless otherwise documented below in the visit note. 

## 2013-11-16 NOTE — Assessment & Plan Note (Signed)
The current medical regimen is effective;  continue present plan and medications.  Lab Results  Component Value Date   TSH 2.37 10/10/2012  check TSH and adjust as needed

## 2013-11-16 NOTE — Progress Notes (Signed)
Subjective:    Patient ID: Melissa Barton, female    DOB: 24-Nov-1960, 53 y.o.   MRN: [id number]  HPI  Patient is here for follow up  Reviewed chronic medical issues and interval medical events  Past Medical History  Diagnosis Date  . DDD (degenerative disc disease), lumbar   . Depression   . GERD (gastroesophageal reflux disease)   . Hyperlipidemia   . Hypertension   . Hypothyroid   . Migraines   . Allergic rhinitis, cause unspecified   . Chronic fatigue syndrome   . Bicuspid aortic valve   . Chronic pain syndrome     headaches, neck and back    Review of Systems  Constitutional: Positive for fatigue (chronic). Negative for fever.  Respiratory: Negative for cough and shortness of breath.   Cardiovascular: Negative for chest pain and leg swelling.  Musculoskeletal: Positive for back pain (chronic). Negative for gait problem and joint swelling.       Objective:   Physical Exam  BP 128/80  Pulse 70  Temp(Src) 98.3 F (36.8 C) (Oral)  Wt 197 lb 9.6 oz (89.631 kg)  SpO2 95% Wt Readings from Last 3 Encounters:  11/16/13 197 lb 9.6 oz (89.631 kg)  12/28/12 181 lb (82.101 kg)  11/20/12 181 lb (82.101 kg)   Constitutional: She is overweight, but appears well-developed and well-nourished. No distress.  Neck: Normal range of motion. Neck supple. No JVD present. No thyromegaly present.  Cardiovascular: Normal rate, regular rhythm and normal heart sounds.  No murmur heard. No BLE edema. Pulmonary/Chest: Effort normal and breath sounds normal. No respiratory distress. She has no wheezes.  Psychiatric: She has a normal mood and affect. Her behavior is normal. Judgment and thought content normal.   Lab Results  Component Value Date   WBC 8.5 02/22/2011   HGB 13.6 02/22/2011   HCT 38.2 02/22/2011   PLT 280 02/22/2011   GLUCOSE 87 02/22/2011   CHOL 205* 10/10/2012   TRIG 103.0 10/10/2012   HDL 54.70 10/10/2012   LDLDIRECT 128.4 10/10/2012   NA 137 02/22/2011   K 4.1 02/22/2011   CL 99  02/22/2011   CREATININE 0.63 02/22/2011   BUN 13 02/22/2011   CO2 28 02/22/2011   TSH 2.37 10/10/2012    No results found.     Assessment & Plan:   Problem List Items Addressed This Visit   ADD (attention deficit disorder)   Chronic fatigue syndrome - Primary     Negative autoimmune workup with unremarkable sedimentation rate, ANA, rheumatoid factor and negative CRP 04/30/2011 New rheum eval by [facility]>> fibromyalgia dx clarified - see same Discussed prior tx of ADD with adderall 2000-2006, ?try vyvanse  Will rx 30mg  qd x 30d and advise follow up with psyc if ineffective    Relevant Orders      Basic metabolic panel      CBC with Differential      Hepatic function panel      TSH   Hyperlipidemia     Began statin spring 2012 because elevated cholesterol during cardiac clearance for back surgery Requested to resume siva 07/2011 but pt declined to do so Check lipids, re-rx as needed    Relevant Medications      lisinopril (PRINIVIL,ZESTRIL) tablet   Other Relevant Orders      Lipid panel   Hypothyroid      The current medical regimen is effective;  continue present plan and medications.  Lab Results  Component Value Date  TSH 2.37 10/10/2012  check TSH and adjust as needed    Relevant Medications      levothyroxine (SYNTHROID, LEVOTHROID) tablet   Other Relevant Orders      TSH

## 2013-11-16 NOTE — Assessment & Plan Note (Signed)
Negative autoimmune workup with unremarkable sedimentation rate, ANA, rheumatoid factor and negative CRP 04/30/2011 New rheum eval by [facility]>> fibromyalgia dx clarified - see same Discussed prior tx of ADD with adderall 2000-2006, ?try vyvanse  Will rx 30mg  qd x 30d and advise follow up with psyc if ineffective

## 2013-11-16 NOTE — Patient Instructions (Signed)
It was good to see you today.  We have reviewed your prior records including labs and tests today  Test(s) ordered today. Return when you're fasting before June 2015 appt. Your results will be released to [place] (or called to you) after review, usually within 72hours after test completion. If any changes need to be made, you will be notified at that same time.  Medications reviewed and updated Trial vyvanase 30mg  daily x 30 days - Your prescription(s) have been given to you to submit to your pharmacy. Please take as directed and contact our office if you believe you are having problem(s) with the medication(s). Refill on other medication(s) as discussed today.  Please schedule followup in June as scheduled, call sooner if problems.

## 2013-12-27 ENCOUNTER — Ambulatory Visit (INDEPENDENT_AMBULATORY_CARE_PROVIDER_SITE_OTHER): Payer: Managed Care, Other (non HMO) | Admitting: Internal Medicine

## 2013-12-27 ENCOUNTER — Other Ambulatory Visit (INDEPENDENT_AMBULATORY_CARE_PROVIDER_SITE_OTHER): Payer: Managed Care, Other (non HMO)

## 2013-12-27 ENCOUNTER — Encounter: Payer: Self-pay | Admitting: Internal Medicine

## 2013-12-27 VITALS — BP 112/80 | HR 65 | Temp 98.1°F | Resp 14 | Wt 191.6 lb

## 2013-12-27 DIAGNOSIS — H9390 Unspecified disorder of ear, unspecified ear: Secondary | ICD-10-CM

## 2013-12-27 DIAGNOSIS — R413 Other amnesia: Secondary | ICD-10-CM

## 2013-12-27 DIAGNOSIS — R51 Headache: Secondary | ICD-10-CM

## 2013-12-27 DIAGNOSIS — Z01118 Encounter for examination of ears and hearing with other abnormal findings: Secondary | ICD-10-CM

## 2013-12-27 DIAGNOSIS — H9319 Tinnitus, unspecified ear: Secondary | ICD-10-CM

## 2013-12-27 DIAGNOSIS — H93A1 Pulsatile tinnitus, right ear: Secondary | ICD-10-CM

## 2013-12-27 LAB — TSH: TSH: 3.92 u[IU]/mL (ref 0.35–4.50)

## 2013-12-27 LAB — RPR

## 2013-12-27 LAB — VITAMIN B12: Vitamin B-12: 294 pg/mL (ref 211–911)

## 2013-12-27 NOTE — Patient Instructions (Signed)
Your next office appointment will be determined based upon review of your pending labs . Those instructions will be transmitted to you through My Chart  OR  by mail. The Neurology referral will be scheduled and you'll be notified of the time. Please keep a diary of your headaches . Document  each occurrence on the calendar with notation of : #1 any prodrome ( any non headache symptom such as marked fatigue,visual changes, ,etc ) which precedes actual headache ; #2) severity on 1-10 scale; #3) any triggers ( food/ drink,enviromenntal or weather changes ,physical or emotional stress) in 8-12 hour period prior to the headache; & #4) response to any medications or other intervention. Please review "Headache" @ [facility] for additional information.   Please think about quitting smoking. Review the risks we discussed. Please call 1-800-QUIT-NOW [phone]) for free smoking cessation counseling.

## 2013-12-27 NOTE — Progress Notes (Signed)
Subjective:    Patient ID: Melissa Barton, female    DOB: 16-Jul-1961, 53 y.o.   MRN: [id number]  HPI  She has been having headaches for approximately 2 weeks which are affected by position. When sitting they seem to be circumferential like a constricting hatband. When lying down they are in the occipital area. These have become more constant. Occasionally she has sharp pain arising from the neck with radiation to the crown  Tylenol and Norco prescribed for her cervical stenosis were of partial benefit  She does have a history of migraines with aura. She's had no prodrome or aura with these headaches  She drinks 3-4 cups coffee a day. She takes no nonsteroidals because of severe reflux. This precludes even baby aspirin Another concern is memory loss. It has been present for 2 weeks as well. She was unable to remember an appointment she had 5/4  She smokes half a pack a day. She expressed some interest in possibly stopping. Smoking risks were reviewed.  Her chart indicates that her last LDL was 128 ; she has a physical scheduled in July.  Review of Systems  For 3 -4  months she's noted a swishing sound in the right neck. She states that she has not heard this the last 2 nights.  Other symptoms include some blurred vision on occasion. She has had intermittent vertigo when first awakening.  She describes weakness, numbness, tingling in  upper extremities as well as some balance issues which she relates to her cervical stenosis. She's fallen twice since headache started       Objective:   Physical Exam Gen.: Healthy and well-nourished in appearance. Alert, appropriate and cooperative throughout exam. Head: Normocephalic without obvious abnormalities  Eyes: No corneal or conjunctival inflammation noted. Pupils equal round reactive to light and accommodation. Extraocular motion intact. [place] grossly normal with /w/o lenses Ears: External  ear exam reveals no significant lesions  or deformities. Canals clear .TMs normal. Hearing is grossly normal bilaterally. The tuning fork exam reveals lateralization to the right ear. AC> BC bilaterally Nose: External nasal exam reveals no deformity or inflammation. Nasal mucosa are pink and moist. No lesions or exudates noted.   Mouth: Oral mucosa and oropharynx reveal no lesions or exudates. Teeth in good repair. Neck: No deformities, masses, or tenderness noted. Range of motion & Thyroid normal. Lungs: Normal respiratory effort; chest expands symmetrically. Lungs are clear to auscultation without rales, wheezes, or increased work of breathing. Heart: Normal rate and rhythm. Normal S1 and S2. No gallop, click, or rub. No murmur.                                 Musculoskeletal/extremities: No deformity or scoliosis noted of  the thoracic or lumbar spine.  No clubbing, cyanosis, edema, or significant extremity  deformity noted. Range of motion normal .Tone & strength normal. Hand joints normal  Fingernail health good. Able to lie down & sit up w/o help. Negative SLR bilaterally Vascular: Carotid, radial artery, dorsalis pedis and  posterior tibial pulses are full and equal. No bruits present. Neurologic: Alert and oriented x3. Deep tendon reflexes symmetrical and normal. Mini-Mental Status exam was completed to evaluate the patient's concerns in reference to memory deficit. Results revealed: no deficit. Named 10 animals in 25 seconds   Gait normal  including heel & toe walking . [place] & finger to nose normal  Skin:  Intact without suspicious lesions or rashes. Lymph: No cervical, axillary lymphadenopathy present. Psych:Anxious with intermittent tearfulness. Normally interactive                                                                                        Assessment & Plan:  #1 daily headaches  #2 abnormal tuning fork evaluation with lateralization to the right  #3 subjective pulsatile tinnitus which has resolved.  R/O acoustic neuroma  #4 memory loss, subjective. This was not documented on testing  I recommend gabapentin 100 mg every 8 hours as needed for headache. She states that she has this on hand  I recommended ENT consultation; she requests a neurology evaluation instead.  Extensive neuropsychiatric consultation may be necessary to assess the memory loss issues.

## 2013-12-27 NOTE — Progress Notes (Signed)
Pre visit review using our clinic review tool, if applicable. No additional management support is needed unless otherwise documented below in the visit note. 

## 2014-01-18 ENCOUNTER — Encounter: Payer: Self-pay | Admitting: Neurology

## 2014-01-18 ENCOUNTER — Ambulatory Visit (INDEPENDENT_AMBULATORY_CARE_PROVIDER_SITE_OTHER): Payer: Managed Care, Other (non HMO) | Admitting: Neurology

## 2014-01-18 VITALS — BP 120/78 | HR 72 | Temp 98.0°F | Ht 65.0 in | Wt 194.0 lb

## 2014-01-18 DIAGNOSIS — R259 Unspecified abnormal involuntary movements: Secondary | ICD-10-CM

## 2014-01-18 DIAGNOSIS — R51 Headache: Secondary | ICD-10-CM | POA: Insufficient documentation

## 2014-01-18 DIAGNOSIS — R413 Other amnesia: Secondary | ICD-10-CM

## 2014-01-18 DIAGNOSIS — M542 Cervicalgia: Secondary | ICD-10-CM | POA: Insufficient documentation

## 2014-01-18 DIAGNOSIS — R258 Other abnormal involuntary movements: Secondary | ICD-10-CM

## 2014-01-18 DIAGNOSIS — R519 Headache, unspecified: Secondary | ICD-10-CM | POA: Insufficient documentation

## 2014-01-18 NOTE — Progress Notes (Signed)
NEUROLOGY CONSULTATION NOTE  [name] MRN: [id number] DOB: 1960/10/04  Referring provider: Dr. Unice Cobble Primary care provider: Dr. Unice Cobble  Reason for consult:  Headaches, memory loss  Dear Dr Linna Darner:  Thank you for your kind referral of Melissa Barton for consultation of the above symptoms. Although her history is well known to you, please allow me to reiterate it for the purpose of our medical record. Records and images were personally reviewed where available.  HISTORY OF PRESENT ILLNESS: This is a pleasant 53 year old right-handed woman with a history of hypertension, hypothyroidism, migraines, fibromyalgia, chronic fatigue syndrome, in her usual state of health until the past few months when she started having a different type of headache from her usual migraines.  Headaches are described a squeezing over the top of her head, at times pounding, 6/10 in intensity, at times lasting several consecutive days.  She feels they are worse when lying down, better when sitting up, if she lies on her left side, that side would hurt more, then if she shifts to the other side, the pain would localize there.  She has tried Tylenol and fish oil. Since 2010, she has had difficulty sleeping, usually 3-4 hours on average, with no clear relation to the headaches.  She reports always being nauseated with GERD, no vomiting, some phonophobia, no photophobia.    She is concerned about 2 episodes in May when she had significant headaches and lost a whole day of conversation. She had talked to her friend about helping her, then did not show up because she did not remember their conversation at all.  She does endorse significant stress and poor sleep. She went to her PCP and was concerned about difficulty naming animals, which is unusual for her.  She drives and denies getting lost, although she has noticed that she now needs to re-orient as to where she is going.  She has forgotten to pay bills  over the past year and occasionally forgets to take her medications..  She became tearful because "not having my faculties is the worst I can think of."  She takes care of her grandson.  She has a history of migraines since age 67, that improved as she got older.  She reports her migraines are not "full-fledged" anymore, usually consisting of bright flashing lights followed by a mild headache.  Last migraine was 2 months ago.  With one of her migraines, she had speech difficulties and numbness/tingling from the left shoulder down her hand.    She has a diagnosis of chronic fatigue syndrome.  She started Vyvanse 2 months ago which seems to help, but she has reduced intake to once a week.  She feels her mood is "usually pretty good," she takes Celexa and has noticed that she cannot cope if she stops it, making her more anxious. She rarely takes Xanax.  She has neck pain, denies diplopia, dysarthria, dysphagia, bowel/bladder dysfunction.  She has a prescription for gabapentin but does not take it.  In the past she was prescribed amitriptyline for headaches but did not take it.  Laboratory Data: Lab Results  Component Value Date   TSH 3.92 12/27/2013   Lab Results  Component Value Date   [id number] 174 12/27/2013   PAST MEDICAL HISTORY: Past Medical History  Diagnosis Date  . DDD (degenerative disc disease), lumbar   . Depression   . GERD (gastroesophageal reflux disease)   . Hyperlipidemia   . Hypertension   .  Hypothyroid   . Migraines   . Allergic rhinitis, cause unspecified   . Chronic fatigue syndrome   . Bicuspid aortic valve   . Chronic pain syndrome     headaches, neck and back    PAST SURGICAL HISTORY: Past Surgical History  Procedure Laterality Date  . Appendectomy  1980  . Abdominal hysterectomy  1990  . Lumbar laminectomy  2010    partial diskectomy  . Surgical repair of multiple fractuers  2011  . Alif  2012  . Nasal fracture surgery    . Nasal septum surgery    .  Esophageal manometry N/A 11/20/2012    Procedure: ESOPHAGEAL MANOMETRY (EM);  Surgeon: Jerene Bears, MD;  Location: [facility];  Service: Gastroenterology;  Laterality: N/A;    MEDICATIONS: Current Outpatient Prescriptions on File Prior to Visit  Medication Sig Dispense Refill  . ALPRAZolam (XANAX) 0.25 MG tablet Take 1 tablet (0.25 mg total) by mouth 2 (two) times daily as needed for anxiety.  60 tablet  3  . Alum Hydroxide-Mag Carbonate (GAVISCON PO) Take by mouth as needed.        . bifidobacterium infantis (ALIGN) capsule Take 1 capsule by mouth daily.  30 capsule  0  . diltiazem (CARTIA XT) 240 MG 24 hr capsule Take 1 capsule (240 mg total) by mouth daily.  30 capsule  5  . esomeprazole (NEXIUM) 40 MG capsule Take 1 capsule (40 mg total) by mouth daily before breakfast.  90 capsule  0  . levothyroxine (SYNTHROID, LEVOTHROID) 75 MCG tablet Take 1 tablet (75 mcg total) by mouth daily.  90 tablet  0  . lisdexamfetamine (VYVANSE) 30 MG capsule Take 1 capsule (30 mg total) by mouth daily.  30 capsule  0  . lisinopril (PRINIVIL,ZESTRIL) 20 MG tablet Take 1 tablet (20 mg total) by mouth daily.  90 tablet  0  . Multiple Vitamin (MULTIVITAMIN) tablet Take 1 tablet by mouth daily.        . ondansetron (ZOFRAN) 4 MG tablet Take 4 mg by mouth every 4 (four) hours as needed.        . sucralfate (CARAFATE) 1 G tablet Take 1 g by mouth 4 (four) times daily.         No current facility-administered medications on file prior to visit.    ALLERGIES: No Known Allergies  FAMILY HISTORY: Family History  Problem Relation Age of Onset  . Arthritis Mother   . Pancreatic cancer Mother   . Arthritis Father   . Clotting disorder Father   . Alcohol abuse Other   . Arthritis Other   . Hyperlipidemia Other   . Hypertension Other   . Diabetes Other     SOCIAL HISTORY: History   Social History  . Marital Status: Married    Spouse Name: N/A    Number of Children: 2  . Years of Education: N/A    Occupational History  . Disabled     RN   Social History Main Topics  . Smoking status: Current Every Day Smoker    Types: Cigarettes  . Smokeless tobacco: Never Used  . Alcohol Use: No  . Drug Use: No  . Sexual Activity: Not on file   Other Topics Concern  . Not on file   Social History Narrative  . No narrative on file    REVIEW OF SYSTEMS: Constitutional: No fevers, chills, or sweats, + generalized fatigue, no change in appetite Eyes: No visual changes, double vision, eye pain  Ear, nose and throat: No hearing loss, ear pain, nasal congestion, sore throat Cardiovascular: No chest pain, palpitations Respiratory:  No shortness of breath at rest or with exertion, wheezes GastrointestinaI: + nausea, no vomiting, diarrhea, abdominal pain, fecal incontinence Genitourinary:  No dysuria, urinary retention or frequency Musculoskeletal:  + neck pain, back pain Integumentary: No rash, pruritus, skin lesions Neurological: as above Psychiatric: No depression, +insomnia, anxiety Endocrine: No palpitations, fatigue, diaphoresis, mood swings, change in appetite, change in weight, increased thirst Hematologic/Lymphatic:  No anemia, purpura, petechiae. Allergic/Immunologic: no itchy/runny eyes, nasal congestion, recent allergic reactions, rashes  PHYSICAL EXAM: Filed Vitals:   01/18/14 1432  BP: 120/78  Pulse: 72  Temp: 98 F (36.7 C)   General: No acute distress Head:  Normocephalic/atraumatic Eyes: Fundoscopic exam shows bilateral sharp discs, no vessel changes, exudates, or hemorrhages Neck: supple, no paraspinal tenderness, full range of motion Back: No paraspinal tenderness Heart: regular rate and rhythm Lungs: Clear to auscultation bilaterally. Vascular: No carotid bruits. Skin/Extremities: No rash, no edema Neurological Exam: Mental status: alert and oriented to person, place, and time, no dysarthria or aphasia, Fund of knowledge is appropriate.  Recent and remote  memory are intact.  Attention and concentration are normal.    Able to name objects and repeat phrases. MOCA 28/30 (missed one point for delayed recall, 1 point for Trail-making test). Cranial nerves: CN I: not tested CN II: pupils equal, round and reactive to light, visual fields intact, fundi unremarkable. CN III, IV, VI:  full range of motion, no nystagmus, no ptosis CN V: facial sensation intact CN VII: upper and lower face symmetric CN VIII: hearing intact to finger rub CN IX, X: gag intact, uvula midline CN XI: sternocleidomastoid and trapezius muscles intact CN XII: tongue midline Bulk & Tone: normal, no fasciculations. Motor: 5/5 throughout with no pronator drift. Sensation: decreased pin on right calf, otherwise intact to light touch, cold, vibration and joint position sense. Intact to all modalities on both UE.  No extinction to double simultaneous stimulation.  Romberg test negative Deep Tendon Reflexes: brisk +3 on both UE, +4 on right LE with 3-beat clonus on right ankle. +3 left LE. Plantar responses: downgoing bilaterally Cerebellar: no incoordination on finger to nose, heel to shin. No dysdiadochokinesia Gait: narrow-based and steady, able to tandem walk adequately. Tremor: none  IMPRESSION: This is a pleasant 53 year old right-handed woman with a history of hypertension, hypothyroidism, migraines, fibromyalgia, chronic fatigue syndrome, presenting with a different type of headache that started several months ago, and 2 episodes of memory loss where she could not recall a day of conversations. MOCA is normal at 28/30, memory change may be related to mood/inattention, however neurological exam shows hyperreflexia with 3-beats of clonus on the right ankle.  MRI brain and cervical spine without contrast will be ordered to assess for underlying structural abnormality.  We discussed the use of headache preventative medications such as Elavil, and neuropsychological testing for memory  change, however she would like to hold off until MRI is done.  She will follow-up in 3 months.  Thank you for allowing me to participate in the care of this patient. Please do not hesitate to call for any questions or concerns.   Ellouise Newer, M.D.

## 2014-01-18 NOTE — Patient Instructions (Addendum)
1. MRI brain without contrast 2. MRI cervical spine without contrast 3. We will consider headache preventative medication and neuropsychological testing after the tests

## 2014-01-21 ENCOUNTER — Encounter: Payer: Self-pay | Admitting: Neurology

## 2014-01-26 ENCOUNTER — Ambulatory Visit
Admission: RE | Admit: 2014-01-26 | Discharge: 2014-01-26 | Disposition: A | Discharge status: Home / Self Care | Payer: Managed Care, Other (non HMO) | Source: Ambulatory Visit | Attending: Neurology | Admitting: Neurology

## 2014-01-26 ENCOUNTER — Inpatient Hospital Stay: Admission: RE | Admit: 2014-01-26 | Payer: Managed Care, Other (non HMO) | Source: Ambulatory Visit

## 2014-01-26 ENCOUNTER — Other Ambulatory Visit: Payer: Managed Care, Other (non HMO)

## 2014-01-26 DIAGNOSIS — M542 Cervicalgia: Secondary | ICD-10-CM

## 2014-01-26 DIAGNOSIS — R413 Other amnesia: Secondary | ICD-10-CM

## 2014-01-26 DIAGNOSIS — R51 Headache: Secondary | ICD-10-CM

## 2014-01-26 DIAGNOSIS — R258 Other abnormal involuntary movements: Secondary | ICD-10-CM

## 2014-01-31 ENCOUNTER — Other Ambulatory Visit: Payer: Self-pay | Admitting: *Deleted

## 2014-01-31 ENCOUNTER — Telehealth: Payer: Self-pay | Admitting: Neurology

## 2014-01-31 DIAGNOSIS — M542 Cervicalgia: Secondary | ICD-10-CM

## 2014-01-31 DIAGNOSIS — R51 Headache: Secondary | ICD-10-CM

## 2014-01-31 MED ORDER — AMITRIPTYLINE HCL 10 MG PO TABS: ORAL_TABLET | ORAL | Status: DC

## 2014-01-31 NOTE — Telephone Encounter (Signed)
Order placed

## 2014-01-31 NOTE — Telephone Encounter (Signed)
I spoke to patient. Discussed normal MRI brain. There are degenerative changes throughout the cervical spine, with some spinal stenosis.  Would recommend physical therapy for neck pain and headaches. Start amitriptyline 10mg  qhs for 1 week, then increase to 20mg  qhs. Side effects were discussed. She will also be referred for neuropsych testing for the memory issues. Patient expressed understanding.

## 2014-02-02 ENCOUNTER — Other Ambulatory Visit: Payer: Managed Care, Other (non HMO)

## 2014-02-14 ENCOUNTER — Encounter: Payer: Managed Care, Other (non HMO) | Admitting: Internal Medicine

## 2014-02-21 ENCOUNTER — Encounter: Payer: Managed Care, Other (non HMO) | Admitting: Internal Medicine

## 2014-03-11 ENCOUNTER — Telehealth: Payer: Self-pay | Admitting: Internal Medicine

## 2014-03-11 DIAGNOSIS — R739 Hyperglycemia, unspecified: Secondary | ICD-10-CM

## 2014-03-11 DIAGNOSIS — Z0000 Encounter for general adult medical examination without abnormal findings: Secondary | ICD-10-CM

## 2014-03-11 NOTE — Telephone Encounter (Signed)
Pt was wondering if he can add an A1C to the lab work. Please advise.

## 2014-03-14 NOTE — Telephone Encounter (Signed)
Pt requested labs...  MD okay'd request.

## 2014-03-14 NOTE — Telephone Encounter (Signed)
Ok to add on or draw new: a1c for dx "hyperglycemia" thanks

## 2014-03-15 ENCOUNTER — Other Ambulatory Visit (INDEPENDENT_AMBULATORY_CARE_PROVIDER_SITE_OTHER): Payer: Managed Care, Other (non HMO)

## 2014-03-15 DIAGNOSIS — R5382 Chronic fatigue, unspecified: Secondary | ICD-10-CM

## 2014-03-15 DIAGNOSIS — Z0000 Encounter for general adult medical examination without abnormal findings: Secondary | ICD-10-CM

## 2014-03-15 DIAGNOSIS — G9332 Myalgic encephalomyelitis/chronic fatigue syndrome: Secondary | ICD-10-CM

## 2014-03-15 DIAGNOSIS — R739 Hyperglycemia, unspecified: Secondary | ICD-10-CM

## 2014-03-15 DIAGNOSIS — E785 Hyperlipidemia, unspecified: Secondary | ICD-10-CM

## 2014-03-15 DIAGNOSIS — R7309 Other abnormal glucose: Secondary | ICD-10-CM

## 2014-03-15 DIAGNOSIS — E039 Hypothyroidism, unspecified: Secondary | ICD-10-CM

## 2014-03-15 LAB — HEPATIC FUNCTION PANEL
ALK PHOS: 61 U/L (ref 39–117)
ALT: 27 U/L (ref 0–35)
AST: 25 U/L (ref 0–37)
Albumin: 3.9 g/dL (ref 3.5–5.2)
BILIRUBIN DIRECT: 0.1 mg/dL (ref 0.0–0.3)
Total Bilirubin: 0.6 mg/dL (ref 0.2–1.2)
Total Protein: 7.2 g/dL (ref 6.0–8.3)

## 2014-03-15 LAB — CBC WITH DIFFERENTIAL/PLATELET
BASOS PCT: 0.5 % (ref 0.0–3.0)
Basophils Absolute: 0 10*3/uL (ref 0.0–0.1)
EOS ABS: 0.2 10*3/uL (ref 0.0–0.7)
EOS PCT: 1.9 % (ref 0.0–5.0)
HCT: 39.4 % (ref 36.0–46.0)
Hemoglobin: 13.6 g/dL (ref 12.0–15.0)
LYMPHS PCT: 25.8 % (ref 12.0–46.0)
Lymphs Abs: 2.5 10*3/uL (ref 0.7–4.0)
MCHC: 34.4 g/dL (ref 30.0–36.0)
MCV: 91 fl (ref 78.0–100.0)
Monocytes Absolute: 0.6 10*3/uL (ref 0.1–1.0)
Monocytes Relative: 6.7 % (ref 3.0–12.0)
NEUTROS PCT: 65.1 % (ref 43.0–77.0)
Neutro Abs: 6.2 10*3/uL (ref 1.4–7.7)
Platelets: 295 10*3/uL (ref 150.0–400.0)
RBC: 4.33 Mil/uL (ref 3.87–5.11)
RDW: 13.2 % (ref 11.5–15.5)
WBC: 9.6 10*3/uL (ref 4.0–10.5)

## 2014-03-15 LAB — LIPID PANEL
CHOL/HDL RATIO: 5
Cholesterol: 222 mg/dL — ABNORMAL HIGH (ref 0–200)
HDL: 40.9 mg/dL (ref >39.00)
LDL Cholesterol: 158 mg/dL — ABNORMAL HIGH (ref 0–99)
NONHDL: 181.1
Triglycerides: 115 mg/dL (ref 0.0–149.0)
VLDL: 23 mg/dL (ref 0.0–40.0)

## 2014-03-15 LAB — BASIC METABOLIC PANEL
BUN: 15 mg/dL (ref 6–23)
CO2: 27 mEq/L (ref 19–32)
Calcium: 9.2 mg/dL (ref 8.4–10.5)
Chloride: 102 mEq/L (ref 96–112)
Creatinine, Ser: 0.9 mg/dL (ref 0.4–1.2)
GFR: 74.45 mL/min (ref >60.00)
GLUCOSE: 90 mg/dL (ref 70–99)
Potassium: 4 mEq/L (ref 3.5–5.1)
Sodium: 136 mEq/L (ref 135–145)

## 2014-03-15 LAB — TSH: TSH: 4 u[IU]/mL (ref 0.35–4.50)

## 2014-03-15 LAB — HEMOGLOBIN A1C: Hgb A1c MFr Bld: 6 % (ref 4.6–6.5)

## 2014-03-21 ENCOUNTER — Encounter: Payer: Self-pay | Admitting: Internal Medicine

## 2014-03-21 ENCOUNTER — Ambulatory Visit (INDEPENDENT_AMBULATORY_CARE_PROVIDER_SITE_OTHER): Payer: Managed Care, Other (non HMO) | Admitting: Internal Medicine

## 2014-03-21 VITALS — BP 132/80 | HR 78 | Temp 97.7°F | Resp 16 | Ht 63.0 in | Wt 198.8 lb

## 2014-03-21 DIAGNOSIS — R5382 Chronic fatigue, unspecified: Secondary | ICD-10-CM

## 2014-03-21 DIAGNOSIS — F988 Other specified behavioral and emotional disorders with onset usually occurring in childhood and adolescence: Secondary | ICD-10-CM

## 2014-03-21 DIAGNOSIS — R7309 Other abnormal glucose: Secondary | ICD-10-CM

## 2014-03-21 DIAGNOSIS — R739 Hyperglycemia, unspecified: Secondary | ICD-10-CM | POA: Insufficient documentation

## 2014-03-21 DIAGNOSIS — I10 Essential (primary) hypertension: Secondary | ICD-10-CM

## 2014-03-21 DIAGNOSIS — Z0000 Encounter for general adult medical examination without abnormal findings: Secondary | ICD-10-CM

## 2014-03-21 DIAGNOSIS — G9332 Myalgic encephalomyelitis/chronic fatigue syndrome: Secondary | ICD-10-CM

## 2014-03-21 DIAGNOSIS — E038 Other specified hypothyroidism: Secondary | ICD-10-CM

## 2014-03-21 MED ORDER — LISDEXAMFETAMINE DIMESYLATE 30 MG PO CAPS: 30.0000 mg | ORAL_CAPSULE | Freq: Every day | ORAL | Status: DC

## 2014-03-21 MED ORDER — TERCONAZOLE 0.8 % VA CREA: 1.0000 | TOPICAL_CREAM | Freq: Every day | VAGINAL | Status: AC

## 2014-03-21 NOTE — Assessment & Plan Note (Signed)
Negative autoimmune workup with unremarkable sedimentation rate, ANA, rheumatoid factor and negative CRP 04/30/2011 Following 2nd opinion rheum eval by [facility]>> fibromyalgia dx clarified, overlap with CFS Discussed prior tx of ADD with adderall 2000-2006, trial vyvanse 10/2013 with good results Will refill rx 30mg  qd x 30d and advise follow up with psyc for additional refills prn

## 2014-03-21 NOTE — Assessment & Plan Note (Signed)
Trial off ACE inhibitor 07/2011 uncontrolled thus resumed lisinopril (lower dose) 08/2011 The current medical regimen is effective;  continue present plan and medications.  BP Readings from Last 3 Encounters:  03/21/14 132/80  01/18/14 120/78  12/27/13 112/80

## 2014-03-21 NOTE — Progress Notes (Signed)
Pre visit review using our clinic review tool, if applicable. No additional management support is needed unless otherwise documented below in the visit note. 

## 2014-03-21 NOTE — Assessment & Plan Note (Signed)
Lab Results  Component Value Date   HGBA1C 6.0 03/15/2014   Discussed risks for diabetes mellitus but no evidence for disease requiring medications at present The patient is asked to make an attempt to improve diet and exercise patterns to aid in medical management of this problem.

## 2014-03-21 NOTE — Assessment & Plan Note (Signed)
The current medical regimen is effective;  continue present plan and medications.  Lab Results  Component Value Date   TSH 4.00 03/15/2014

## 2014-03-21 NOTE — Patient Instructions (Addendum)
It was good to see you today.  We have reviewed your prior records including labs and tests today  Health Maintenance reviewed - all recommended immunizations and age-appropriate screenings are up-to-date.  Medications reviewed and updated, no changes recommended at this time. Refill on medication(s) as discussed today.  Please schedule followup in 12 months for annual exam and labs, call sooner if problems.  Health Maintenance Adopting a healthy lifestyle and getting preventive care can go a long way to promote health and wellness. Talk with your health care provider about what schedule of regular examinations is right for you. This is a good chance for you to check in with your provider about disease prevention and staying healthy. In between checkups, there are plenty of things you can do on your own. Experts have done a lot of research about which lifestyle changes and preventive measures are most likely to keep you healthy. Ask your health care provider for more information. WEIGHT AND DIET  Eat a healthy diet  Be sure to include plenty of vegetables, fruits, low-fat dairy products, and lean protein.  Do not eat a lot of foods high in solid fats, added sugars, or salt.  Get regular exercise. This is one of the most important things you can do for your health.  Most adults should exercise for at least 150 minutes each week. The exercise should increase your heart rate and make you sweat (moderate-intensity exercise).  Most adults should also do strengthening exercises at least twice a week. This is in addition to the moderate-intensity exercise.  Maintain a healthy weight  Body mass index (BMI) is a measurement that can be used to identify possible weight problems. It estimates body fat based on height and weight. Your health care provider can help determine your BMI and help you achieve or maintain a healthy weight.  For females 31 years of age and older:   A BMI below 18.5 is  considered underweight.  A BMI of 18.5 to 24.9 is normal.  A BMI of 25 to 29.9 is considered overweight.  A BMI of 30 and above is considered obese.  Watch levels of cholesterol and blood lipids  You should start having your blood tested for lipids and cholesterol at 53 years of age, then have this test every 5 years.  You may need to have your cholesterol levels checked more often if:  Your lipid or cholesterol levels are high.  You are older than 53 years of age.  You are at high risk for heart disease.  CANCER SCREENING   Lung Cancer  Lung cancer screening is recommended for adults 51-59 years old who are at high risk for lung cancer because of a history of smoking.  A yearly low-dose CT scan of the lungs is recommended for people who:  Currently smoke.  Have quit within the past 15 years.  Have at least a 30-pack-year history of smoking. A pack year is smoking an average of one pack of cigarettes a day for 1 year.  Yearly screening should continue until it has been 15 years since you quit.  Yearly screening should stop if you develop a health problem that would prevent you from having lung cancer treatment.  Breast Cancer  Practice breast self-awareness. This means understanding how your breasts normally appear and feel.  It also means doing regular breast self-exams. Let your health care provider know about any changes, no matter how small.  If you are in your 20s or 30s, you  should have a clinical breast exam (CBE) by a health care provider every 1-3 years as part of a regular health exam.  If you are 68 or older, have a CBE every year. Also consider having a breast X-ray (mammogram) every year.  If you have a family history of breast cancer, talk to your health care provider about genetic screening.  If you are at high risk for breast cancer, talk to your health care provider about having an MRI and a mammogram every year.  Breast cancer gene (BRCA)  assessment is recommended for women who have family members with BRCA-related cancers. BRCA-related cancers include:  Breast.  Ovarian.  Tubal.  Peritoneal cancers.  Results of the assessment will determine the need for genetic counseling and BRCA1 and BRCA2 testing. Cervical Cancer Routine pelvic examinations to screen for cervical cancer are no longer recommended for nonpregnant women who are considered low risk for cancer of the pelvic organs (ovaries, uterus, and vagina) and who do not have symptoms. A pelvic examination may be necessary if you have symptoms including those associated with pelvic infections. Ask your health care provider if a screening pelvic exam is right for you.   The Pap test is the screening test for cervical cancer for women who are considered at risk.  If you had a hysterectomy for a problem that was not cancer or a condition that could lead to cancer, then you no longer need Pap tests.  If you are older than 65 years, and you have had normal Pap tests for the past 10 years, you no longer need to have Pap tests.  If you have had past treatment for cervical cancer or a condition that could lead to cancer, you need Pap tests and screening for cancer for at least 20 years after your treatment.  If you no longer get a Pap test, assess your risk factors if they change (such as having a new sexual partner). This can affect whether you should start being screened again.  Some women have medical problems that increase their chance of getting cervical cancer. If this is the case for you, your health care provider may recommend more frequent screening and Pap tests.  The human papillomavirus (HPV) test is another test that may be used for cervical cancer screening. The HPV test looks for the virus that can cause cell changes in the cervix. The cells collected during the Pap test can be tested for HPV.  The HPV test can be used to screen women 61 years of age and older.  Getting tested for HPV can extend the interval between normal Pap tests from three to five years.  An HPV test also should be used to screen women of any age who have unclear Pap test results.  After 53 years of age, women should have HPV testing as often as Pap tests.  Colorectal Cancer  This type of cancer can be detected and often prevented.  Routine colorectal cancer screening usually begins at 53 years of age and continues through 53 years of age.  Your health care provider may recommend screening at an earlier age if you have risk factors for colon cancer.  Your health care provider may also recommend using home test kits to check for hidden blood in the stool.  A small camera at the end of a tube can be used to examine your colon directly (sigmoidoscopy or colonoscopy). This is done to check for the earliest forms of colorectal cancer.  Routine screening  usually begins at age 3.  Direct examination of the colon should be repeated every 5-10 years through 53 years of age. However, you may need to be screened more often if early forms of precancerous polyps or small growths are found. Skin Cancer  Check your skin from head to toe regularly.  Tell your health care provider about any new moles or changes in moles, especially if there is a change in a mole's shape or color.  Also tell your health care provider if you have a mole that is larger than the size of a pencil eraser.  Always use sunscreen. Apply sunscreen liberally and repeatedly throughout the day.  Protect yourself by wearing long sleeves, pants, a wide-brimmed hat, and sunglasses whenever you are outside. HEART DISEASE, DIABETES, AND HIGH BLOOD PRESSURE   Have your blood pressure checked at least every 1-2 years. High blood pressure causes heart disease and increases the risk of stroke.  If you are between 89 years and 20 years old, ask your health care provider if you should take aspirin to prevent  strokes.  Have regular diabetes screenings. This involves taking a blood sample to check your fasting blood sugar level.  If you are at a normal weight and have a low risk for diabetes, have this test once every three years after 53 years of age.  If you are overweight and have a high risk for diabetes, consider being tested at a younger age or more often. PREVENTING INFECTION  Hepatitis B  If you have a higher risk for hepatitis B, you should be screened for this virus. You are considered at high risk for hepatitis B if:  You were born in a country where hepatitis B is common. Ask your health care provider which countries are considered high risk.  Your parents were born in a high-risk country, and you have not been immunized against hepatitis B (hepatitis B vaccine).  You have HIV or AIDS.  You use needles to inject street drugs.  You live with someone who has hepatitis B.  You have had sex with someone who has hepatitis B.  You get hemodialysis treatment.  You take certain medicines for conditions, including cancer, organ transplantation, and autoimmune conditions. Hepatitis C  Blood testing is recommended for:  Everyone born from [birth year] through 1965.  Anyone with known risk factors for hepatitis C. Sexually transmitted infections (STIs)  You should be screened for sexually transmitted infections (STIs) including gonorrhea and chlamydia if:  You are sexually active and are younger than 53 years of age.  You are older than 53 years of age and your health care provider tells you that you are at risk for this type of infection.  Your sexual activity has changed since you were last screened and you are at an increased risk for chlamydia or gonorrhea. Ask your health care provider if you are at risk.  If you do not have HIV, but are at risk, it may be recommended that you take a prescription medicine daily to prevent HIV infection. This is called pre-exposure prophylaxis  (PrEP). You are considered at risk if:  You are sexually active and do not regularly use condoms or know the HIV status of your partner(s).  You take drugs by injection.  You are sexually active with a partner who has HIV. Talk with your health care provider about whether you are at high risk of being infected with HIV. If you choose to begin PrEP, you should first be tested  for HIV. You should then be tested every 3 months for as long as you are taking PrEP.  PREGNANCY   If you are premenopausal and you may become pregnant, ask your health care provider about preconception counseling.  If you may become pregnant, take 400 to 800 micrograms (mcg) of folic acid every day.  If you want to prevent pregnancy, talk to your health care provider about birth control (contraception). OSTEOPOROSIS AND MENOPAUSE   Osteoporosis is a disease in which the bones lose minerals and strength with aging. This can result in serious bone fractures. Your risk for osteoporosis can be identified using a bone density scan.  If you are 38 years of age or older, or if you are at risk for osteoporosis and fractures, ask your health care provider if you should be screened.  Ask your health care provider whether you should take a calcium or vitamin D supplement to lower your risk for osteoporosis.  Menopause may have certain physical symptoms and risks.  Hormone replacement therapy may reduce some of these symptoms and risks. Talk to your health care provider about whether hormone replacement therapy is right for you.  HOME CARE INSTRUCTIONS   Schedule regular health, dental, and eye exams.  Stay current with your immunizations.   Do not use any tobacco products including cigarettes, chewing tobacco, or electronic cigarettes.  If you are pregnant, do not drink alcohol.  If you are breastfeeding, limit how much and how often you drink alcohol.  Limit alcohol intake to no more than 1 drink per day for  nonpregnant women. One drink equals 12 ounces of beer, 5 ounces of wine, or 1 ounces of hard liquor.  Do not use street drugs.  Do not share needles.  Ask your health care provider for help if you need support or information about quitting drugs.  Tell your health care provider if you often feel depressed.  Tell your health care provider if you have ever been abused or do not feel safe at home. Document Released: 02/22/2011 Document Revised: 12/24/2013 Document Reviewed: 07/11/2013 [facility] Patient Information 2015 [place], [place]. This information is not intended to replace advice given to you by your health care provider. Make sure you discuss any questions you have with your health care provider.

## 2014-03-21 NOTE — Progress Notes (Signed)
Subjective:    Patient ID: Melissa Barton, female    DOB: February 20, 1961, 53 y.o.   MRN: [id number]  HPI  patient is here today for annual physical and for medicare wellness  Diet: heart healthy  Physical activity: sedentary Depression/mood screen: negative Hearing: intact to whispered voice Visual acuity: grossly normal, performs annual eye exam  ADLs: capable Fall risk: none Home safety: good Cognitive evaluation: intact to orientation, naming, recall and repetition EOL planning: adv directives, full code/ I agree  I have personally reviewed and have noted 1. The patient's medical and social history 2. Their use of alcohol, tobacco or illicit drugs 3. Their current medications and supplements 4. The patient's functional ability including ADL's, fall risks, home safety risks and hearing or visual impairment. 5. Diet and physical activities 6. Evidence for depression or mood disorders  Also reviewed chronic medical issues and interval medical events  Past Medical History  Diagnosis Date  . DDD (degenerative disc disease), lumbar   . Depression   . GERD (gastroesophageal reflux disease)   . Hyperlipidemia   . Hypertension   . Hypothyroid   . Migraines   . Allergic rhinitis, cause unspecified   . Chronic fatigue syndrome   . Bicuspid aortic valve   . Chronic pain syndrome     headaches, neck and back   Family History  Problem Relation Age of Onset  . Arthritis Mother   . Pancreatic cancer Mother   . Arthritis Father   . Clotting disorder Father   . Alcohol abuse Other   . Arthritis Other   . Hyperlipidemia Other   . Hypertension Other   . Diabetes Other    History  Substance Use Topics  . Smoking status: Current Every Day Smoker    Types: Cigarettes  . Smokeless tobacco: Never Used  . Alcohol Use: No    Review of Systems  Constitutional: Positive for fatigue (chronic). Negative for unexpected weight change.  Respiratory: Negative for cough, shortness of  breath and wheezing.   Cardiovascular: Negative for chest pain, palpitations and leg swelling.  Gastrointestinal: Negative for nausea, abdominal pain and diarrhea.  Musculoskeletal: Positive for arthralgias, back pain, myalgias and neck pain.  Neurological: Positive for headaches. Negative for dizziness, weakness and light-headedness.  Psychiatric/Behavioral: Positive for sleep disturbance. Negative for dysphoric mood. The patient is not nervous/anxious.   All other systems reviewed and are negative.      Objective:   Physical Exam  BP 132/80  Pulse 78  Temp(Src) 97.7 F (36.5 C) (Oral)  Resp 16  Ht 5\' 3"  (1.6 m)  Wt 198 lb 12.8 oz (90.175 kg)  BMI 35.22 kg/m2  SpO2 94% Wt Readings from Last 3 Encounters:  03/21/14 198 lb 12.8 oz (90.175 kg)  01/18/14 194 lb (87.998 kg)  12/27/13 191 lb 9.6 oz (86.909 kg)   Constitutional: She appears well-developed and well-nourished. No distress.  HENT: Head: Normocephalic and atraumatic. Ears: B TMs ok, no erythema or effusion; Nose: Nose normal. Mouth/Throat: Oropharynx is clear and moist. No oropharyngeal exudate.  Eyes: Conjunctivae and EOM are normal. Pupils are equal, round, and reactive to light. No scleral icterus.  Neck: Normal range of motion. Neck supple. No JVD present. No thyromegaly present.  Cardiovascular: Normal rate, regular rhythm and normal heart sounds.  No murmur heard. No BLE edema. Pulmonary/Chest: Effort normal and breath sounds normal. No respiratory distress. She has no wheezes.  Abdominal: Soft. Bowel sounds are normal. She exhibits no distension. There is no  tenderness. no masses GU/breast: defer to gyn Musculoskeletal: Normal range of motion, no joint effusions. No gross deformities Neurological: She is alert and oriented to person, place, and time. No cranial nerve deficit. Coordination, balance, strength, speech and gait are normal.  Skin: Skin is warm and dry. No rash noted. No erythema.  Psychiatric: She has a  normal mood and affect. Her behavior is normal. Judgment and thought content normal.    Lab Results  Component Value Date   WBC 9.6 03/15/2014   HGB 13.6 03/15/2014   HCT 39.4 03/15/2014   PLT 295.0 03/15/2014   GLUCOSE 90 03/15/2014   CHOL 222* 03/15/2014   TRIG 115.0 03/15/2014   HDL 40.90 03/15/2014   LDLDIRECT 128.4 10/10/2012   LDLCALC 158* 03/15/2014   ALT 27 03/15/2014   AST 25 03/15/2014   NA 136 03/15/2014   K 4.0 03/15/2014   CL 102 03/15/2014   CREATININE 0.9 03/15/2014   BUN 15 03/15/2014   CO2 27 03/15/2014   TSH 4.00 03/15/2014   HGBA1C 6.0 03/15/2014    Mr Brain Wo Contrast  01/26/2014   CLINICAL DATA:  Headache and neck pain.  Memory loss.  EXAM: MRI HEAD WITHOUT CONTRAST  TECHNIQUE: Multiplanar, multiecho pulse sequences of the brain and surrounding structures were obtained without intravenous contrast.  COMPARISON:  Cervical spine MRI from the same day. MRI brain 06/06/2009.  FINDINGS: No acute infarct, hemorrhage, or mass lesion is present. The ventricles are of normal size. No significant extraaxial fluid collection is present.  Flow is present in the major intracranial arteries. The globes and orbits are intact. The paranasal sinuses and mastoid air cells are clear.  Focal signal abnormality is again seen in the midline of the clivus. There is restricted diffusion as before. This has not change significantly in size, measuring 5 mm in transverse diameter. There was previously worked up with CT and felt to be benign. The finding is stable.  IMPRESSION: 1. Normal MRI appearance of the brain. 2. Stable focal signal abnormality in the midline of the clivus. This was previously shown to the a well corticated osseous defect with probable lymphoid tissue or a benign no other coronal remnant. The   Electronically Signed   By: Lawrence Santiago M.D.   On: 01/26/2014 19:49   Mr Cervical Spine Wo Contrast  01/26/2014   CLINICAL DATA:  Neck pain.  Clonus.  Left neck and shoulder pain.  EXAM: MRI  CERVICAL SPINE WITHOUT CONTRAST  TECHNIQUE: Multiplanar, multisequence MR imaging of the cervical spine was performed. No intravenous contrast was administered.  COMPARISON:  MRI cervical spine 06/02/2009.  FINDINGS: Normal signal is present in the cervical and upper thoracic spinal cord to the lowest imaged level, T2-3. Mild endplate marrow changes are present at C4-5, C5-6, and C6-7. The craniocervical junction is within normal limits. The visualized intracranial contents are normal.  C2-3: Mild left foraminal narrowing is secondary to asymmetric left-sided facet hypertrophy. This is similar to the prior study.  C3-4: A progressive rightward disc osteophyte complex is present. Mild central and right foraminal stenosis is slightly worse than previous.  C4-5: A mild broad-based disc osteophyte complex partially effaces ventral CSF. Asymmetric left-sided facet hypertrophy contributes to mild left foraminal stenosis.  C5-6: A broad-based disc osteophyte complex is slightly worse than on the prior exam. Mild foraminal narrowing is worse on the right due to uncovertebral disease.  C6-7: A progressive leftward disc osteophyte complex is present. There is effacement of the ventral CSF  and probable contact of the cord. Mild foraminal narrowing is worse on the left.  C7-T1: Negative.  IMPRESSION: 1. Mild progression of multilevel spondylosis as described. 2. Similar appearance of mild left foraminal narrowing at C2-3. 3. Mild central and right foraminal narrowing at C3-4 with some progression. 4. Mild left foraminal narrowing at C4-5. 5. Mild central and foraminal narrowing at C5-6 is worse on the right with some progression. 6. The most significant level is C6-7 with a progressive leftward disc osteophyte complex resulting in mild to moderate left central canal stenosis and mild foraminal narrowing bilaterally, worse on the left.   Electronically Signed   By: Lawrence Santiago M.D.   On: 01/26/2014 18:26       Assessment  & Plan:   CPX/v70.0 - Patient has been counseled on age-appropriate routine health concerns for screening and prevention. These are reviewed and up-to-date. Immunizations are up-to-date or declined. Labs reviewed.  Problem List Items Addressed This Visit   ADD (attention deficit disorder)   Chronic fatigue syndrome     Negative autoimmune workup with unremarkable sedimentation rate, ANA, rheumatoid factor and negative CRP 04/30/2011 Following 2nd opinion rheum eval by [facility]>> fibromyalgia dx clarified, overlap with CFS Discussed prior tx of ADD with adderall 2000-2006, trial vyvanse 10/2013 with good results Will refill rx 30mg  qd x 30d and advise follow up with psyc for additional refills prn    Hyperglycemia      Lab Results  Component Value Date   HGBA1C 6.0 03/15/2014   Discussed risks for diabetes mellitus but no evidence for disease requiring medications at present The patient is asked to make an attempt to improve diet and exercise patterns to aid in medical management of this problem.     Hypertension      Trial off ACE inhibitor 07/2011 uncontrolled thus resumed lisinopril (lower dose) 08/2011 The current medical regimen is effective;  continue present plan and medications.  BP Readings from Last 3 Encounters:  03/21/14 132/80  01/18/14 120/78  12/27/13 112/80      Hypothyroid      The current medical regimen is effective;  continue present plan and medications.  Lab Results  Component Value Date   TSH 4.00 03/15/2014       Other Visit Diagnoses   Routine general medical examination at a health care facility    -  Primary

## 2014-06-18 ENCOUNTER — Telehealth: Payer: Self-pay | Admitting: Internal Medicine

## 2014-06-18 MED ORDER — LISINOPRIL 20 MG PO TABS: 20.0000 mg | ORAL_TABLET | Freq: Every day | ORAL | Status: DC

## 2014-06-18 MED ORDER — ESOMEPRAZOLE MAGNESIUM 40 MG PO CPDR: 40.0000 mg | DELAYED_RELEASE_CAPSULE | Freq: Every day | ORAL | Status: DC

## 2014-06-18 MED ORDER — LEVOTHYROXINE SODIUM 75 MCG PO TABS: 75.0000 ug | ORAL_TABLET | Freq: Every day | ORAL | Status: DC

## 2014-06-18 NOTE — Telephone Encounter (Signed)
Called pt no answer LMOM sent refills except for the citalopram. Med is not on med list. If she need medication pls give [name] a call back...Melissa Barton

## 2014-06-18 NOTE — Telephone Encounter (Signed)
Pt requesting refill of Nexium 40 mg, Lisinopril 20 mg, Synthroid 75 mcg (see med list), Citalopram 20 mg [facility] [address], [place] [phone] Pt states Dr Asa Lente did not need to be seen for a year, and to call for refills. [phone]

## 2014-06-21 ENCOUNTER — Encounter: Payer: Self-pay | Admitting: Internal Medicine

## 2017-11-17 ENCOUNTER — Encounter: Payer: Self-pay | Admitting: *Deleted

## 2017-11-21 ENCOUNTER — Encounter: Payer: Self-pay | Admitting: Internal Medicine

## 2023-06-14 ENCOUNTER — Encounter: Payer: Self-pay | Admitting: *Deleted

## 2023-06-30 ENCOUNTER — Telehealth: Payer: Self-pay | Admitting: *Deleted

## 2023-06-30 NOTE — Telephone Encounter (Signed)
  Procedure: colonoscopy  Height: 5'4" Weight: 173 lb       Have you had a colonoscopy before?  Yes, 10/19, Dr.Gaspari and 12/28/12, Dr.Pyrtle  Do you have family history of colon cancer?  no  Do you have a family history of polyps? yes  Previous colonoscopy with polyps removed? yes  Do you have a history colorectal cancer?   no  Are you diabetic?  no  Do you have a prosthetic or mechanical heart valve? no  Do you have a pacemaker/defibrillator?   no  Have you had endocarditis/atrial fibrillation?  no  Do you use supplemental oxygen/CPAP?  no  Have you had joint replacement within the last 12 months?  no  Do you tend to be constipated or have to use laxatives?  yes   Do you have history of alcohol use? If yes, how much and how often.  no  Do you have history or are you using drugs? If yes, what do are you  using?  no  Have you ever had a stroke/heart attack?  no  Have you ever had a heart or other vascular stent placed,?no  Do you take weight loss medication? no  female patients,: have you had a hysterectomy? yes                              are you post menopausal?  Not sure                              do you still have your menstrual cycle? no    Date of last menstrual period? 1990  Do you take any blood-thinning medications such as: (Plavix, aspirin, Coumadin, Aggrenox, Brilinta, Xarelto, Eliquis, Pradaxa, Savaysa or Effient)? no  If yes we need the name, milligram, dosage and who is prescribing doctor:  n/a             Current Outpatient Medications  Medication Sig Dispense Refill   ALPRAZolam (XANAX) 0.25 MG tablet Take 1 tablet (0.25 mg total) by mouth 2 (two) times daily as needed for anxiety. 60 tablet 3   Alum Hydroxide-Mag Carbonate (GAVISCON EXTRA STRENGTH PO) Take 1 tablet by mouth daily as needed (as needed).     amLODipine (NORVASC) 10 MG tablet Take 10 mg by mouth daily.     buPROPion (WELLBUTRIN XL) 150 MG 24 hr tablet Take 150 mg by mouth daily.      Cholecalciferol (VITAMIN D) 50 MCG (2000 UT) CAPS Take 2,000 Units by mouth daily.     levothyroxine (SYNTHROID) 150 MCG tablet Take 150 mcg by mouth daily before breakfast.     losartan (COZAAR) 100 MG tablet Take 100 mg by mouth daily.     sucralfate (CARAFATE) 1 G tablet Take 1 g by mouth 4 (four) times daily.       No current facility-administered medications for this visit.    Allergies  Allergen Reactions   Latex Itching    "WATER EYES, AND RUNNING NOSE"   Topiramate Swelling    "lips felt thick"

## 2023-08-09 ENCOUNTER — Telehealth: Payer: Self-pay | Admitting: Internal Medicine

## 2023-08-09 ENCOUNTER — Encounter: Payer: Self-pay | Admitting: Pediatrics

## 2023-08-09 NOTE — Telephone Encounter (Signed)
PT scheduled with Maren Beach 10/24/2023

## 2023-08-09 NOTE — Telephone Encounter (Signed)
Ok to see another provider given the wait to see me is currently > 3 months

## 2023-08-09 NOTE — Telephone Encounter (Signed)
Good morning Dr. Rhea Belton  The following patient was under your care about 10years ago and wishes to come back to our facility after moving to [place]. She had a colonoscopy in 2019 and is having a hard time getting the pathology report from [facility]. I did send a request but she wants to know would it be okay to see another provider being that your schedule is far out. Please advise.

## 2023-08-18 NOTE — Telephone Encounter (Signed)
Good morning, Dr. Rhea Belton,   We received the records from [facility] for patient's Endo Colon in 2019. Would you please review and advise if patient can be scheduled directly for colonoscopy? Records have been scanned into Media for you to review.   Thank you.

## 2023-08-22 NOTE — Telephone Encounter (Signed)
EGD.colon records received and reviewed She had a complete colonoscopy to the terminal ileum with excellent bowel prep in 2019; no adenomatous polyps were found; 2 small hyperplastic rectal polyps removed without dysplasia Recall would be recommended at 10 years given the most recent colonoscopy and lack of family history of colon cancer Please explain this to the patient we can place a recall for 10 years time [facility]

## 2023-08-22 NOTE — Telephone Encounter (Signed)
Left detailed message advising patient of 10 year recall. Recall placed in Epic. Advised patient to call back if any questions.

## 2023-08-31 NOTE — Telephone Encounter (Signed)
 Ok to schedule. ASA 2.  Starting 3 days before prep, bisacodyl 10mg  daily.

## 2023-09-01 ENCOUNTER — Encounter: Payer: Self-pay | Admitting: *Deleted

## 2023-09-07 ENCOUNTER — Other Ambulatory Visit: Payer: Self-pay | Admitting: *Deleted

## 2023-09-07 ENCOUNTER — Encounter: Payer: Self-pay | Admitting: *Deleted

## 2023-09-07 ENCOUNTER — Encounter (INDEPENDENT_AMBULATORY_CARE_PROVIDER_SITE_OTHER): Payer: Self-pay | Admitting: *Deleted

## 2023-09-07 MED ORDER — PEG 3350-KCL-NA BICARB-NACL 420 G PO SOLR: 4000.0000 mL | Freq: Once | ORAL | 0 refills | Status: AC

## 2023-09-07 NOTE — Telephone Encounter (Signed)
 Referral completed, [facility] apt letter sent to PCP

## 2023-09-07 NOTE — Telephone Encounter (Signed)
 Pt has been scheduled for 09/29/23 with Dr.Rourk, instructions mailed and prep sent to the pharmacy

## 2023-09-28 NOTE — Anesthesia Preprocedure Evaluation (Addendum)
 Anesthesia Evaluation  Patient identified by MRN, date of birth, ID band Patient awake    Reviewed: Allergy & Precautions, H&P , NPO status , Patient's Chart, lab work & pertinent test results, reviewed documented beta blocker date and time   History of Anesthesia Complications (+) PONV and history of anesthetic complications  Airway Mallampati: II  TM Distance: >3 FB Neck ROM: full    Dental no notable dental hx. (+) Dental Advisory Given, Teeth Intact   Pulmonary Current Smoker   Pulmonary exam normal breath sounds clear to auscultation       Cardiovascular Exercise Tolerance: Good hypertension, Normal cardiovascular exam Rhythm:regular Rate:Normal  Bicuspid aortic valve   Neuro/Psych  Headaches PSYCHIATRIC DISORDERS  Depression     Neuromuscular disease    GI/Hepatic Neg liver ROS,GERD  ,,  Endo/Other  Hypothyroidism    Renal/GU negative Renal ROS  negative genitourinary   Musculoskeletal  (+) Arthritis , Osteoarthritis,  Fibromyalgia -  Abdominal   Peds  Hematology negative hematology ROS (+)   Anesthesia Other Findings Chronic fatigue syndrome  Chronic pain syndrome  Reproductive/Obstetrics negative OB ROS                             Anesthesia Physical Anesthesia Plan  ASA: 2  Anesthesia Plan: General   Post-op Pain Management: Minimal or no pain anticipated   Induction:   PONV Risk Score and Plan: Propofol  infusion  Airway Management Planned: Nasal Cannula and Natural Airway  Additional Equipment: None  Intra-op Plan:   Post-operative Plan:   Informed Consent: I have reviewed the patients History and Physical, chart, labs and discussed the procedure including the risks, benefits and alternatives for the proposed anesthesia with the patient or authorized representative who has indicated his/her understanding and acceptance.     Dental Advisory Given  Plan  Discussed with: CRNA  Anesthesia Plan Comments:         Anesthesia Quick Evaluation

## 2023-09-29 ENCOUNTER — Other Ambulatory Visit: Payer: Self-pay

## 2023-09-29 ENCOUNTER — Encounter (HOSPITAL_COMMUNITY)
Admission: RE | Disposition: A | Discharge status: Home / Self Care | Payer: Self-pay | Source: Home / Self Care | Attending: Internal Medicine

## 2023-09-29 ENCOUNTER — Ambulatory Visit (HOSPITAL_BASED_OUTPATIENT_CLINIC_OR_DEPARTMENT_OTHER): Payer: Medicare Other | Admitting: Anesthesiology

## 2023-09-29 ENCOUNTER — Encounter (HOSPITAL_COMMUNITY): Payer: Self-pay | Admitting: Internal Medicine

## 2023-09-29 ENCOUNTER — Ambulatory Visit (HOSPITAL_COMMUNITY): Payer: Self-pay | Admitting: Anesthesiology

## 2023-09-29 ENCOUNTER — Ambulatory Visit (HOSPITAL_COMMUNITY)
Admission: RE | Admit: 2023-09-29 | Discharge: 2023-09-29 | Disposition: A | Discharge status: Home / Self Care | Payer: Medicare Other | Attending: Internal Medicine | Admitting: Internal Medicine

## 2023-09-29 DIAGNOSIS — K5730 Diverticulosis of large intestine without perforation or abscess without bleeding: Secondary | ICD-10-CM | POA: Diagnosis not present

## 2023-09-29 DIAGNOSIS — D122 Benign neoplasm of ascending colon: Secondary | ICD-10-CM | POA: Diagnosis not present

## 2023-09-29 DIAGNOSIS — E039 Hypothyroidism, unspecified: Secondary | ICD-10-CM | POA: Diagnosis not present

## 2023-09-29 DIAGNOSIS — Q2381 Bicuspid aortic valve: Secondary | ICD-10-CM | POA: Diagnosis not present

## 2023-09-29 DIAGNOSIS — I10 Essential (primary) hypertension: Secondary | ICD-10-CM | POA: Diagnosis not present

## 2023-09-29 DIAGNOSIS — Z1211 Encounter for screening for malignant neoplasm of colon: Secondary | ICD-10-CM | POA: Diagnosis present

## 2023-09-29 DIAGNOSIS — F17210 Nicotine dependence, cigarettes, uncomplicated: Secondary | ICD-10-CM | POA: Diagnosis not present

## 2023-09-29 DIAGNOSIS — D123 Benign neoplasm of transverse colon: Secondary | ICD-10-CM | POA: Diagnosis not present

## 2023-09-29 DIAGNOSIS — F32A Depression, unspecified: Secondary | ICD-10-CM | POA: Diagnosis not present

## 2023-09-29 DIAGNOSIS — Z83719 Family history of colon polyps, unspecified: Secondary | ICD-10-CM | POA: Insufficient documentation

## 2023-09-29 HISTORY — PX: POLYPECTOMY: SHX5525

## 2023-09-29 HISTORY — DX: Other specified postprocedural states: Z98.890

## 2023-09-29 HISTORY — PX: COLONOSCOPY WITH PROPOFOL: SHX5780

## 2023-09-29 SURGERY — COLONOSCOPY WITH PROPOFOL
Anesthesia: General

## 2023-09-29 MED ORDER — PROPOFOL 10 MG/ML IV BOLUS
INTRAVENOUS | Status: DC | PRN
  Administered 2023-09-29: 30 mg via INTRAVENOUS
  Administered 2023-09-29: 80 mg via INTRAVENOUS

## 2023-09-29 MED ORDER — PROPOFOL 500 MG/50ML IV EMUL
INTRAVENOUS | Status: DC | PRN
  Administered 2023-09-29: 150 ug/kg/min via INTRAVENOUS

## 2023-09-29 MED ORDER — LIDOCAINE HCL (CARDIAC) PF 100 MG/5ML IV SOSY
PREFILLED_SYRINGE | INTRAVENOUS | Status: DC | PRN
  Administered 2023-09-29: 50 mg via INTRATRACHEAL

## 2023-09-29 MED ORDER — LACTATED RINGERS IV SOLN: INTRAVENOUS | Status: DC | PRN

## 2023-09-29 NOTE — Discharge Instructions (Addendum)
  Colonoscopy Discharge Instructions  Read the instructions outlined below and refer to this sheet in the next few weeks. These discharge instructions provide you with general information on caring for yourself after you leave the hospital. Your doctor may also give you specific instructions. While your treatment has been planned according to the most current medical practices available, unavoidable complications occasionally occur. If you have any problems or questions after discharge, call Dr. Shaaron at [phone]. ACTIVITY You may resume your regular activity, but move at a slower pace for the next 24 hours.  Take frequent rest periods for the next 24 hours.  Walking will help get rid of the air and reduce the bloated feeling in your belly (abdomen).  No driving for 24 hours (because of the medicine (anesthesia) used during the test).   Do not sign any important legal documents or operate any machinery for 24 hours (because of the anesthesia used during the test).  NUTRITION Drink plenty of fluids.  You may resume your normal diet as instructed by your doctor.  Begin with a light meal and progress to your normal diet. Heavy or fried foods are harder to digest and may make you feel sick to your stomach (nauseated).  Avoid alcoholic beverages for 24 hours or as instructed.  MEDICATIONS You may resume your normal medications unless your doctor tells you otherwise.  WHAT YOU CAN EXPECT TODAY Some feelings of bloating in the abdomen.  Passage of more gas than usual.  Spotting of blood in your stool or on the toilet paper.  IF YOU HAD POLYPS REMOVED DURING THE COLONOSCOPY: No aspirin products for 7 days or as instructed.  No alcohol for 7 days or as instructed.  Eat a soft diet for the next 24 hours.  FINDING OUT THE RESULTS OF YOUR TEST Not all test results are available during your visit. If your test results are not back during the visit, make an appointment with your caregiver to find out the  results. Do not assume everything is normal if you have not heard from your caregiver or the medical facility. It is important for you to follow up on all of your test results.  SEEK IMMEDIATE MEDICAL ATTENTION IF: You have more than a spotting of blood in your stool.  Your belly is swollen (abdominal distention).  You are nauseated or vomiting.  You have a temperature over 101.  You have abdominal pain or discomfort that is severe or gets worse throughout the day.      found diverticulosis in the left side of your colon  2 polyps found and removed today  Information on diverticulosis and colon polyp provided  Further recommendations to follow pending review of pathology report  As discussed, plan to see you back in the office in 3 to 4 weeks Kathye Kerns) for further evaluation of  poorly controlled acid reflux and difficulty swallowing.   at patient request, I called Frances Sluder at [phone] findings and recommendations

## 2023-09-29 NOTE — Anesthesia Postprocedure Evaluation (Signed)
 Anesthesia Post Note  Patient: Melissa Barton  Procedure(s) Performed: COLONOSCOPY WITH PROPOFOL  POLYPECTOMY  Patient location during evaluation: Endoscopy Anesthesia Type: General Level of consciousness: awake and alert Pain management: pain level controlled Vital Signs Assessment: post-procedure vital signs reviewed and stable Respiratory status: spontaneous breathing Cardiovascular status: blood pressure returned to baseline and stable Postop Assessment: no apparent nausea or vomiting Anesthetic complications: no   No notable events documented.   Last Vitals:  Vitals:   09/29/23 0724 09/29/23 0853  BP: 123/67 118/66  Pulse: 63 74  Resp: 12 19  Temp: 36.8 C 36.5 C  SpO2: 96% 97%    Last Pain:  Vitals:   09/29/23 0857  TempSrc:   PainSc: 0-No pain                 Shundra Wirsing

## 2023-09-29 NOTE — Anesthesia Postprocedure Evaluation (Signed)
 Anesthesia Post Note  Patient: Melissa Barton  Procedure(s) Performed: COLONOSCOPY WITH PROPOFOL  POLYPECTOMY  Patient location during evaluation: PACU Anesthesia Type: General Level of consciousness: awake and alert Pain management: pain level controlled Vital Signs Assessment: post-procedure vital signs reviewed and stable Respiratory status: spontaneous breathing, nonlabored ventilation, respiratory function stable and patient connected to nasal cannula oxygen Cardiovascular status: blood pressure returned to baseline and stable Postop Assessment: no apparent nausea or vomiting Anesthetic complications: no   There were no known notable events for this encounter.   Last Vitals:  Vitals:   09/29/23 0724 09/29/23 0853  BP: 123/67 118/66  Pulse: 63 74  Resp: 12 19  Temp: 36.8 C 36.5 C  SpO2: 96% 97%    Last Pain:  Vitals:   09/29/23 0857  TempSrc:   PainSc: 0-No pain                 Mckaila Duffus L Gregorio Worley

## 2023-09-29 NOTE — Transfer of Care (Signed)
 Immediate Anesthesia Transfer of Care Note  Patient: Melissa Barton  Procedure(s) Performed: COLONOSCOPY WITH PROPOFOL  POLYPECTOMY  Patient Location: Short Stay  Anesthesia Type:General  Level of Consciousness: awake  Airway & Oxygen Therapy: Patient Spontanous Breathing  Post-op Assessment: Report given to RN  Post vital signs: Reviewed and stable  Last Vitals:  Vitals Value Taken Time  BP 118/66 09/29/23 0853  Temp 36.5 C 09/29/23 0853  Pulse 74 09/29/23 0853  Resp 19 09/29/23 0853  SpO2 97 % 09/29/23 0853    Last Pain:  Vitals:   09/29/23 0857  TempSrc:   PainSc: 0-No pain      Patients Stated Pain Goal: 5 (09/29/23 0724)  Complications: No notable events documented.

## 2023-09-29 NOTE — Op Note (Signed)
 [facility] Patient Name: Melissa Barton Procedure Date: 09/29/2023 8:06 AM MRN: [id number] Date of Birth: June 21, 1961 Attending MD: Lamar Ozell Hollingshead , MD, [id number] CSN: [id number] Age: 63 Admit Type: Outpatient Procedure:                Colonoscopy Indications:              Colon cancer screening in patient at increased                            risk: Family history of 1st-degree relative with                            colon polyps Providers:                Lamar Ozell Hollingshead, MD, Harlene Lips, Jon Loge Referring MD:              Medicines:                Propofol  per Anesthesia Complications:            No immediate complications. Estimated Blood Loss:     Estimated blood loss was minimal. Procedure:                Pre-Anesthesia Assessment:                           - Prior to the procedure, a History and Physical                            was performed, and patient medications and                            allergies were reviewed. The patient's tolerance of                            previous anesthesia was also reviewed. The risks                            and benefits of the procedure and the sedation                            options and risks were discussed with the patient.                            All questions were answered, and informed consent                            was obtained. Prior Anticoagulants: The patient has                            taken no anticoagulant or antiplatelet agents. ASA                            Grade  Assessment: II - A patient with mild systemic                            disease. After reviewing the risks and benefits,                            the patient was deemed in satisfactory condition to                            undergo the procedure.                           After obtaining informed consent, the colonoscope                            was passed under direct vision.  Throughout the                            procedure, the patient's blood pressure, pulse, and                            oxygen saturations were monitored continuously. The                            [id number]) scope was introduced through the                            anus and advanced to the the cecum, identified by                            appendiceal orifice and ileocecal valve. The                            ileocecal valve, appendiceal orifice, and rectum                            were photographed. Scope In: 8:35:07 AM Scope Out: 8:48:04 AM Scope Withdrawal Time: 0 hours 7 minutes 14 seconds  Total Procedure Duration: 0 hours 12 minutes 57 seconds  Findings:      The perianal and digital rectal examinations were normal.      Two sessile polyps were found in the mid transverse colon, ascending       colon and mid ascending colon. The polyps were 5 to 6 mm in size. These       polyps were removed with a cold snare. Resection and retrieval were       complete. Estimated blood loss was minimal.      Scattered small-mouthed diverticula were found in the sigmoid colon and       descending colon.      The exam was otherwise without abnormality on direct and retroflexion       views. Impression:               - Two 5 to 6 mm polyps in the mid transverse colon,  in the ascending colon and in the mid ascending                            colon, removed with a cold snare. Resected and                            retrieved.                           - Diverticulosis in the sigmoid colon and in the                            descending colon.                           - The examination was otherwise normal on direct                            and retroflexion views. Moderate Sedation:      Moderate (conscious) sedation was personally administered by an       anesthesia professional. The following parameters were monitored: oxygen       saturation, heart  rate, blood pressure, respiratory rate, EKG, adequacy       of pulmonary ventilation, and response to care. Recommendation:           - Patient has a contact number available for                            emergencies. The signs and symptoms of potential                            delayed complications were discussed with the                            patient. Return to normal activities tomorrow.                            Written discharge instructions were provided to the                            patient.                           - Advance diet as tolerated.                           - Continue present medications.                           - Repeat colonoscopy date to be determined after                            pending pathology results are reviewed for                            surveillance.                           -  Return to GI office in 4 weeks. Of note, patient                            relates daily reflux symptoms takes a variety of                            OTC agents and. Takes Carafate. Has dysphagia as                            well. Prior EGDs with dilation. Was on a PPI. She                            reports associated with the acute kidney injury.                            Cessation of PPI associated with returning to                            normal as she reports. Renal function returned to                            normal. Procedure Code(s):        --- Professional ---                           [phone], Colonoscopy, flexible; with removal of                            tumor(s), polyp(s), or other lesion(s) by snare                            technique Diagnosis Code(s):        --- Professional ---                           Z83.71, Family history of colonic polyps                           D12.3, Benign neoplasm of transverse colon (hepatic                            flexure or splenic flexure)                           D12.2, Benign neoplasm of  ascending colon                           K57.30, Diverticulosis of large intestine without                            perforation or abscess without bleeding CPT copyright 2022 [employer]. All rights reserved. The codes documented in this report are preliminary and upon coder review may  be revised to meet current compliance requirements. Lamar HERO. Shaaron, MD Lamar Ozell Shaaron, MD 09/29/2023 8:58:42 AM  This report has been signed electronically. Number of Addenda: 0

## 2023-09-29 NOTE — H&P (Signed)
 @LOGO @   Primary Care Physician:  Erskine Neptune, FNP Primary Gastroenterologist:  Dr. Terryl  Pre-Procedure History & Physical: HPI:  Melissa Barton is a 63 y.o. female is here for a screening colonoscopy.  Last colonoscopy report in epic 2014 hyperplastic polyps (2 small rectosigmoid).  Here for screening.  Note in her chart from Dr. Albertus in December noted last colonoscopy was 2019 and slated her for 10 years.  Epic records since 2014 was her last colonoscopy.  However, patient states she had a colonoscopy in [place] in 2019.  Patient notes that her sister has precancerous polyps and has her colonoscopy every 5 years.  Currently, patient has no GI symptoms. Past Medical History:  Diagnosis Date   Allergic rhinitis, cause unspecified    Bicuspid aortic valve    Chronic fatigue syndrome    Chronic pain syndrome    headaches, neck and back   DDD (degenerative disc disease), lumbar    Depression    GERD (gastroesophageal reflux disease)    Hyperlipidemia    Hypertension    Hypothyroid    Migraines    PONV (postoperative nausea and vomiting)     Past Surgical History:  Procedure Laterality Date   ABDOMINAL HYSTERECTOMY  1990   ALIF  2012   APPENDECTOMY  1980   ESOPHAGEAL MANOMETRY N/A 11/20/2012   Procedure: ESOPHAGEAL MANOMETRY (EM);  Surgeon: Gordy CHRISTELLA Albertus, MD;  Location: [facility];  Service: Gastroenterology;  Laterality: N/A;   LUMBAR LAMINECTOMY  2010   partial diskectomy   NASAL FRACTURE SURGERY     NASAL SEPTUM SURGERY     surgical repair of multiple fractuers  2011    Prior to Admission medications   Medication Sig Start Date End Date Taking? Authorizing Provider  ALPRAZolam  (XANAX ) 0.25 MG tablet Take 1 tablet (0.25 mg total) by mouth 2 (two) times daily as needed for anxiety. 11/16/13  Yes Inocencio Berwyn LABOR, MD  Alum Hydroxide-Mag Carbonate (GAVISCON EXTRA STRENGTH PO) Take 1 tablet by mouth daily as needed (as needed).   Yes [provider]   amLODipine (NORVASC) 10 MG tablet Take 10 mg by mouth daily. 03/25/22  Yes [provider]  buPROPion (WELLBUTRIN XL) 150 MG 24 hr tablet Take 150 mg by mouth daily.   Yes [provider]  levothyroxine  (SYNTHROID ) 150 MCG tablet Take 150 mcg by mouth daily before breakfast. 06/08/23  Yes [provider]  losartan (COZAAR) 100 MG tablet Take 100 mg by mouth daily. 03/25/22  Yes [provider]  Cholecalciferol (VITAMIN D) 50 MCG (2000 UT) CAPS Take 2,000 Units by mouth daily.    [provider]  sucralfate (CARAFATE) 1 G tablet Take 1 g by mouth 4 (four) times daily.      [provider]    Allergies as of 09/07/2023 - Review Complete 06/30/2023  Allergen Reaction Noted   Latex Itching 03/17/2018   Topiramate Swelling 12/16/2017    Family History  Problem Relation Age of Onset   Arthritis Mother    Pancreatic cancer Mother    Diabetes Mother    Arthritis Father    Clotting disorder Father    Alcohol abuse Other    Arthritis Other    Hyperlipidemia Other    Hypertension Other    Diabetes Sister     Social History   Socioeconomic History   Marital status: Married    Spouse name: Not on file   Number of children: 2   Years of education: Not  on file   Highest education level: Not on file  Occupational History   Occupation: Disabled    Employer: UNEMPLOYED    Comment: RN  Tobacco Use   Smoking status: Every Day    Types: Cigarettes   Smokeless tobacco: Never  Vaping Use   Vaping status: Never Used  Substance and Sexual Activity   Alcohol use: No   Drug use: No   Sexual activity: Not on file  Other Topics Concern   Not on file  Social History Narrative   Not on file   Social Drivers of Health   Financial Resource Strain: Low Risk  (02/27/2023)   Received from [facility]   Overall Financial Resource Strain (CARDIA)    Difficulty of Paying Living Expenses: Not very hard  Food Insecurity: No Food Insecurity  (02/27/2023)   Received [facility]   Hunger Vital Sign    Worried About Running Out of Food in the Last Year: Never true    Ran Out of Food in the Last Year: Never true  Transportation Needs: No Transportation Needs (02/27/2023)   Received from [facility] - Transportation    Lack of Transportation (Medical): No    Lack of Transportation (Non-Medical): No  Physical Activity: Unknown (02/27/2023)   Received from [facility]   Exercise Vital Sign    Days of Exercise per Week: 0 days    Minutes of Exercise per Session: Not on file  Stress: Stress Concern Present (02/27/2023)   Received from [facility] - Occupational Stress Questionnaire    Feeling of Stress : To some extent  Social Connections: Somewhat Isolated (02/27/2023)   Received from [facility]   Social Network    How would you rate your social network (family, work, friends)?: Restricted participation with some degree of social isolation  Intimate Partner Violence: Not At Risk (02/27/2023)   Received from [facility]   HITS    Over the last 12 months how often did your partner physically hurt you?: Never    Over the last 12 months how often did your partner insult you or talk down to you?: Never    Over the last 12 months how often did your partner threaten you with physical harm?: Never    Over the last 12 months how often did your partner scream or curse at you?: Never    Review of Systems: See HPI, otherwise negative ROS  Physical Exam: BP 123/67   Pulse 63   Temp 98.3 F (36.8 C) (Oral)   Resp 12   Ht 5' 4 (1.626 m)   Wt 83.9 kg   SpO2 96%   BMI 31.76 kg/m  General:   Alert,  Well-developed, well-nourished, pleasant and cooperative in NAD Neck:  Supple; no masses or thyromegaly. Lungs:  Clear throughout to auscultation.   No wheezes, crackles, or rhonchi. No acute distress. Heart:  Regular rate and rhythm; no murmurs, clicks, rubs,  or  gallops. Abdomen:  Soft, nontender and nondistended. No masses, hepatosplenomegaly or hernias noted. Normal bowel sounds, without guarding, and without rebound.    Impression/Plan: SHIREE ALTEMUS is now here to undergo a screening colonoscopy.  Family history of polyps in first-degree relative.  Risks, benefits, limitations, imponderables and alternatives regarding colonoscopy have been reviewed with the patient. Questions have been answered. All parties agreeable.     Notice:  This dictation was prepared with Dragon dictation along with smaller phrase  technology. Any transcriptional errors that result from this process are unintentional and may not be corrected upon review.

## 2023-09-30 ENCOUNTER — Encounter (HOSPITAL_COMMUNITY): Payer: Self-pay | Admitting: Internal Medicine

## 2023-09-30 LAB — SURGICAL PATHOLOGY

## 2023-10-03 ENCOUNTER — Encounter: Payer: Self-pay | Admitting: Internal Medicine

## 2023-10-24 ENCOUNTER — Ambulatory Visit: Payer: Medicare Other | Admitting: Pediatrics

## 2024-04-21 NOTE — Progress Notes (Signed)
 Melissa Pean, FNP Reason for referral-hypertension and bicuspid aortic valve  HPI: 63 year old female for evaluation of hypertension and bicuspid aortic valve at request of Melissa Pean, FNP.  Echocardiogram at [facility] December 2024 showed normal LV function, bicuspid aortic valve with mild aortic stenosis (mean gradient 16.1 mmHg).  Monitor December 2024 showed sinus rhythm with short runs of SVT, PACs and PVCs.  Previously followed at [facility] but transitioning to me.  Patient states she had COVID in April and since that time she has noticed some increased dyspnea on exertion.  However she denies orthopnea, pedal edema, exertional chest pain or syncope.  Current Outpatient Medications  Medication Sig Dispense Refill   ALPRAZolam  (XANAX ) 0.25 MG tablet Take 1 tablet (0.25 mg total) by mouth 2 (two) times daily as needed for anxiety. 60 tablet 3   Alum Hydroxide-Mag Carbonate (GAVISCON EXTRA STRENGTH PO) Take 1 tablet by mouth daily as needed (as needed).     buPROPion (WELLBUTRIN XL) 150 MG 24 hr tablet Take 150 mg by mouth daily.     Cholecalciferol (VITAMIN D) 50 MCG (2000 UT) CAPS Take 2,000 Units by mouth daily.     levothyroxine  (SYNTHROID ) 150 MCG tablet Take 150 mcg by mouth daily before breakfast.     losartan (COZAAR) 100 MG tablet Take 100 mg by mouth daily.     sucralfate (CARAFATE) 1 G tablet Take 1 g by mouth 4 (four) times daily.       No current facility-administered medications for this visit.    Allergies  Allergen Reactions   Latex Itching    WATER EYES, AND RUNNING NOSE   Topiramate Swelling    lips felt thick     Past Medical History:  Diagnosis Date   Allergic rhinitis, cause unspecified    Bicuspid aortic valve    Chronic fatigue syndrome    Chronic pain syndrome    headaches, neck and back   DDD (degenerative disc disease), lumbar    Depression    GERD (gastroesophageal reflux disease)    Hyperlipidemia    Hypertension     Hypothyroid    Migraines    PONV (postoperative nausea and vomiting)     Past Surgical History:  Procedure Laterality Date   ABDOMINAL HYSTERECTOMY  1990   ALIF  2012   APPENDECTOMY  1980   COLONOSCOPY WITH PROPOFOL  N/A 09/29/2023   Procedure: COLONOSCOPY WITH PROPOFOL ;  Surgeon: Shaaron Lamar HERO, MD;  [facility];  Service: Endoscopy;  Laterality: N/A;  8:15 am, asa 2   ESOPHAGEAL MANOMETRY N/A 11/20/2012   Procedure: ESOPHAGEAL MANOMETRY (EM);  Surgeon: Gordy HERO Starch, MD;  Location: [facility];  Service: Gastroenterology;  Laterality: N/A;   LUMBAR LAMINECTOMY  2010   partial diskectomy   NASAL FRACTURE SURGERY     NASAL SEPTUM SURGERY     POLYPECTOMY  09/29/2023   Procedure: POLYPECTOMY;  Surgeon: Shaaron Lamar HERO, MD;  Location: [facility];  Service: Endoscopy;;   surgical repair of multiple fractuers  2011    Social History   Socioeconomic History   Marital status: Married    Spouse name: Not on file   Number of children: 2   Years of education: Not on file   Highest education level: Not on file  Occupational History   Occupation: Disabled    Employer: UNEMPLOYED    Comment: RN  Tobacco Use   Smoking status: Every Day    Types: Cigarettes   Smokeless tobacco:  Never  Vaping Use   Vaping status: Never Used  Substance and Sexual Activity   Alcohol use: Yes    Comment: Rare   Drug use: No   Sexual activity: Not on file  Other Topics Concern   Not on file  Social History Narrative   Not on file   Social Drivers of Health   Financial Resource Strain: Low Risk  (03/31/2024)   Received from [facility]   Overall Financial Resource Strain (CARDIA)    How hard is it for you to pay for the very basics like food, housing, medical care, and heating?: Not very hard  Food Insecurity: No Food Insecurity (04/24/2024)   Received from [facility]   Hunger Vital Sign    Within the past 12 months, you worried that your food would run out before you got the  money to buy more.: Never true    Within the past 12 months, the food you bought just didn't last and you didn't have money to get more.: Never true  Transportation Needs: No Transportation Needs (04/24/2024)   Received from [facility] - Transportation    In the past 12 months, has lack of transportation kept you from medical appointments or from getting medications?: No    In the past 12 months, has lack of transportation kept you from meetings, work, or from getting things needed for daily living?: No  Physical Activity: Inactive (03/31/2024)   Received from [facility]   Exercise Vital Sign    On average, how many days per week do you engage in moderate to strenuous exercise (like a brisk walk)?: 0 days    Minutes of Exercise per Session: Not on file  Stress: Stress Concern Present (03/31/2024)   Received from [facility] of Occupational Health - Occupational Stress Questionnaire    Do you feel stress - tense, restless, nervous, or anxious, or unable to sleep at night because your mind is troubled all the time - these days?: To some extent  Social Connections: Moderately Integrated (03/31/2024)   Received from [facility]   Social Network    How would you rate your social network (family, work, friends)?: Adequate participation with social networks  Intimate Partner Violence: Not At Risk (04/24/2024)   Received from [facility]   HITS    Over the last 12 months how often did your partner physically hurt you?: Never    Over the last 12 months how often did your partner insult you or talk down to you?: Never    Over the last 12 months how often did your partner threaten you with physical harm?: Never    Over the last 12 months how often did your partner scream or curse at you?: Never    Family History  Problem Relation Age of Onset   Cancer Mother    Arthritis Mother    Pancreatic cancer Mother    Diabetes Mother    Arthritis Father    Clotting  disorder Father    Diabetes Sister    Alcohol abuse Other    Arthritis Other    Hyperlipidemia Other    Hypertension Other     ROS: no fevers or chills, productive cough, hemoptysis, dysphasia, odynophagia, melena, hematochezia, dysuria, hematuria, rash, seizure activity, orthopnea, PND, pedal edema, claudication. Remaining systems are negative.  Physical Exam:   Blood pressure 129/71, pulse 65, height 5' 4 (1.626 m), weight 193 lb 12.8 oz (87.9 kg), SpO2  95%.  General:  Well developed/well nourished in NAD Skin warm/dry Patient not depressed No peripheral clubbing Back-normal HEENT-normal/normal eyelids Neck supple/normal carotid upstroke bilaterally; no bruits; no JVD; no thyromegaly chest - CTA/ normal expansion CV - RRR/normal S1 and S2; no rubs or gallops;  PMI nondisplaced; 2/6 systolic murmur left sternal border.  S2 is not diminished. Abdomen -NT/ND, no HSM, no mass, + bowel sounds, no bruit 2+ femoral pulses, no bruits Ext-no edema, chords, 2+ DP Neuro-grossly nonfocal  EKG Interpretation Date/Time:  Wednesday May 02 2024 10:48:38 EDT Ventricular Rate:  65 PR Interval:  144 QRS Duration:  76 QT Interval:  390 QTC Calculation: 405 R Axis:   45  Text Interpretation: Normal sinus rhythm Normal ECG Confirmed by Pietro Rogue ([id number]) on 05/02/2024 11:05:13 AM    A/P  1 bicuspid aortic valve with mild aortic stenosis-plan follow-up echocardiogram December 2025.  We discussed the symptoms to be aware of including worsening dyspnea on exertion, syncope or chest pain.  She understands she will likely require aortic valve replacement in the future.  2 hypertension-blood pressure controlled.  Continue present medical regimen.  3 history of palpitations-patient has occasional palpitations at present described as a flutter.  Recent monitor as outlined above.  Can consider beta-blocker in the future if needed.  4 tobacco abuse-patient counseled on  discontinuing.  Rogue Pietro, MD

## 2024-05-02 ENCOUNTER — Ambulatory Visit (INDEPENDENT_AMBULATORY_CARE_PROVIDER_SITE_OTHER): Admitting: Cardiology

## 2024-05-02 ENCOUNTER — Ambulatory Visit
Admission: RE | Admit: 2024-05-02 | Discharge: 2024-05-02 | Disposition: A | Discharge status: Home / Self Care | Source: Ambulatory Visit | Attending: Cardiology | Admitting: Cardiology

## 2024-05-02 ENCOUNTER — Encounter: Payer: Self-pay | Admitting: Cardiology

## 2024-05-02 VITALS — BP 129/71 | HR 65 | Ht 64.0 in | Wt 193.8 lb

## 2024-05-02 DIAGNOSIS — Q2381 Bicuspid aortic valve: Secondary | ICD-10-CM

## 2024-05-02 DIAGNOSIS — Z720 Tobacco use: Secondary | ICD-10-CM | POA: Diagnosis not present

## 2024-05-02 DIAGNOSIS — I10 Essential (primary) hypertension: Secondary | ICD-10-CM | POA: Diagnosis not present

## 2024-05-02 NOTE — Patient Instructions (Signed)
  Testing/Procedures:  Your physician has requested that you have an echocardiogram. Echocardiography is a painless test that uses sound waves to create images of your heart. It provides your doctor with information about the size and shape of your heart and how well your heart's chambers and valves are working. This procedure takes approximately one hour. There are no restrictions for this procedure. Please do NOT wear cologne, perfume, aftershave, or lotions (deodorant is allowed). Please arrive 15 minutes prior to your appointment time.  Please note: We ask at that you not bring children with you during ultrasound (echo/ vascular) testing. Due to room size and safety concerns, children are not allowed in the ultrasound rooms during exams. Our front office staff cannot provide observation of children in our lobby area while testing is being conducted. An adult accompanying a patient to their appointment will only be allowed in the ultrasound room at the discretion of the ultrasound technician under special circumstances. We apologize for any inconvenience. [facility] FLOOR IMAGING DEPARTMENT  Follow-Up: At [facility], you and your health needs are our priority.  As part of our continuing mission to provide you with exceptional heart care, our providers are all part of one team.  This team includes your primary Cardiologist (physician) and Advanced Practice Providers or APPs (Physician Assistants and Nurse Practitioners) who all work together to provide you with the care you need, when you need it.  Your next appointment:   12 month(s)  Provider:   Alexandria Angel, MD

## 2024-05-03 ENCOUNTER — Ambulatory Visit: Payer: Self-pay | Admitting: Cardiology

## 2024-05-03 DIAGNOSIS — Q2381 Bicuspid aortic valve: Secondary | ICD-10-CM

## 2024-05-03 LAB — ECHOCARDIOGRAM COMPLETE
AR max vel: 0.98 cm2
AV Area VTI: 0.98 cm2
AV Area mean vel: 0.96 cm2
AV Mean grad: 18.5 mmHg
AV Peak grad: 28.3 mmHg
Ao pk vel: 2.66 m/s
Area-P 1/2: 3.54 cm2
Calc EF: 59.4 %
Height: 64 in
MV M vel: 2.42 m/s
MV Peak grad: 23.4 mmHg
S' Lateral: 2.5 cm
Single Plane A2C EF: 59.6 %
Single Plane A4C EF: 57.5 %
Weight: 3100.8 [oz_av]
